# Patient Record
Sex: Female | Born: 1940
Health system: Southern US, Community
[De-identification: ages and names within clinical notes are randomized; demographics above are authoritative.]

## PROBLEM LIST (undated history)

## (undated) DIAGNOSIS — F419 Anxiety disorder, unspecified: Secondary | ICD-10-CM

## (undated) DIAGNOSIS — I10 Essential (primary) hypertension: Secondary | ICD-10-CM

## (undated) DIAGNOSIS — IMO0002 Reserved for concepts with insufficient information to code with codable children: Secondary | ICD-10-CM

## (undated) DIAGNOSIS — K649 Unspecified hemorrhoids: Secondary | ICD-10-CM

## (undated) DIAGNOSIS — E7800 Pure hypercholesterolemia, unspecified: Secondary | ICD-10-CM

## (undated) DIAGNOSIS — M545 Low back pain, unspecified: Secondary | ICD-10-CM

## (undated) DIAGNOSIS — E039 Hypothyroidism, unspecified: Secondary | ICD-10-CM

## (undated) DIAGNOSIS — M1990 Unspecified osteoarthritis, unspecified site: Secondary | ICD-10-CM

## (undated) HISTORY — DX: Anxiety disorder, unspecified: F41.9

## (undated) HISTORY — PX: CATARACT EXTRACTION, BILATERAL: SHX1313

## (undated) HISTORY — DX: Low back pain, unspecified: M54.50

## (undated) HISTORY — DX: Hypothyroidism, unspecified: E03.9

## (undated) HISTORY — DX: Reserved for concepts with insufficient information to code with codable children: IMO0002

## (undated) HISTORY — DX: Essential (primary) hypertension: I10

## (undated) HISTORY — DX: Pure hypercholesterolemia, unspecified: E78.00

## (undated) HISTORY — DX: Low back pain: M54.5

## (undated) HISTORY — PX: TONSILLECTOMY: SUR1361

---

## 1997-11-08 ENCOUNTER — Other Ambulatory Visit: Admission: RE | Admit: 1997-11-08 | Discharge: 1997-11-08 | Payer: Self-pay | Admitting: Obstetrics and Gynecology

## 1998-02-14 ENCOUNTER — Other Ambulatory Visit: Admission: RE | Admit: 1998-02-14 | Discharge: 1998-02-14 | Payer: Self-pay | Admitting: Internal Medicine

## 1998-03-14 ENCOUNTER — Encounter: Payer: Self-pay | Admitting: Internal Medicine

## 1998-03-14 ENCOUNTER — Ambulatory Visit (HOSPITAL_COMMUNITY): Admission: RE | Admit: 1998-03-14 | Discharge: 1998-03-14 | Payer: Self-pay | Admitting: Internal Medicine

## 2000-03-26 ENCOUNTER — Encounter: Payer: Self-pay | Admitting: Internal Medicine

## 2000-03-26 ENCOUNTER — Encounter: Admission: RE | Admit: 2000-03-26 | Discharge: 2000-03-26 | Payer: Self-pay | Admitting: Internal Medicine

## 2000-04-05 ENCOUNTER — Ambulatory Visit (HOSPITAL_COMMUNITY): Admission: RE | Admit: 2000-04-05 | Discharge: 2000-04-05 | Payer: Self-pay | Admitting: Internal Medicine

## 2000-04-05 ENCOUNTER — Encounter: Payer: Self-pay | Admitting: Internal Medicine

## 2004-10-20 ENCOUNTER — Other Ambulatory Visit: Admission: RE | Admit: 2004-10-20 | Discharge: 2004-10-20 | Payer: Self-pay | Admitting: Surgery

## 2006-06-08 ENCOUNTER — Other Ambulatory Visit: Admission: RE | Admit: 2006-06-08 | Discharge: 2006-06-08 | Payer: Self-pay | Admitting: *Deleted

## 2007-07-21 ENCOUNTER — Encounter: Admission: RE | Admit: 2007-07-21 | Discharge: 2007-07-21 | Payer: Self-pay | Admitting: Family Medicine

## 2009-02-08 IMAGING — CR DG HAND COMPLETE 3+V*L*
2 series · 2 of 2 positions shown · non-contrast
Comparison: None

CLINICAL DATA: Injured left fifth finger enhance clear per several
days ago now with redness and swelling.

LEFT HAND - COMPLETE 3+ VIEW

[view not recorded (1 of 2)]
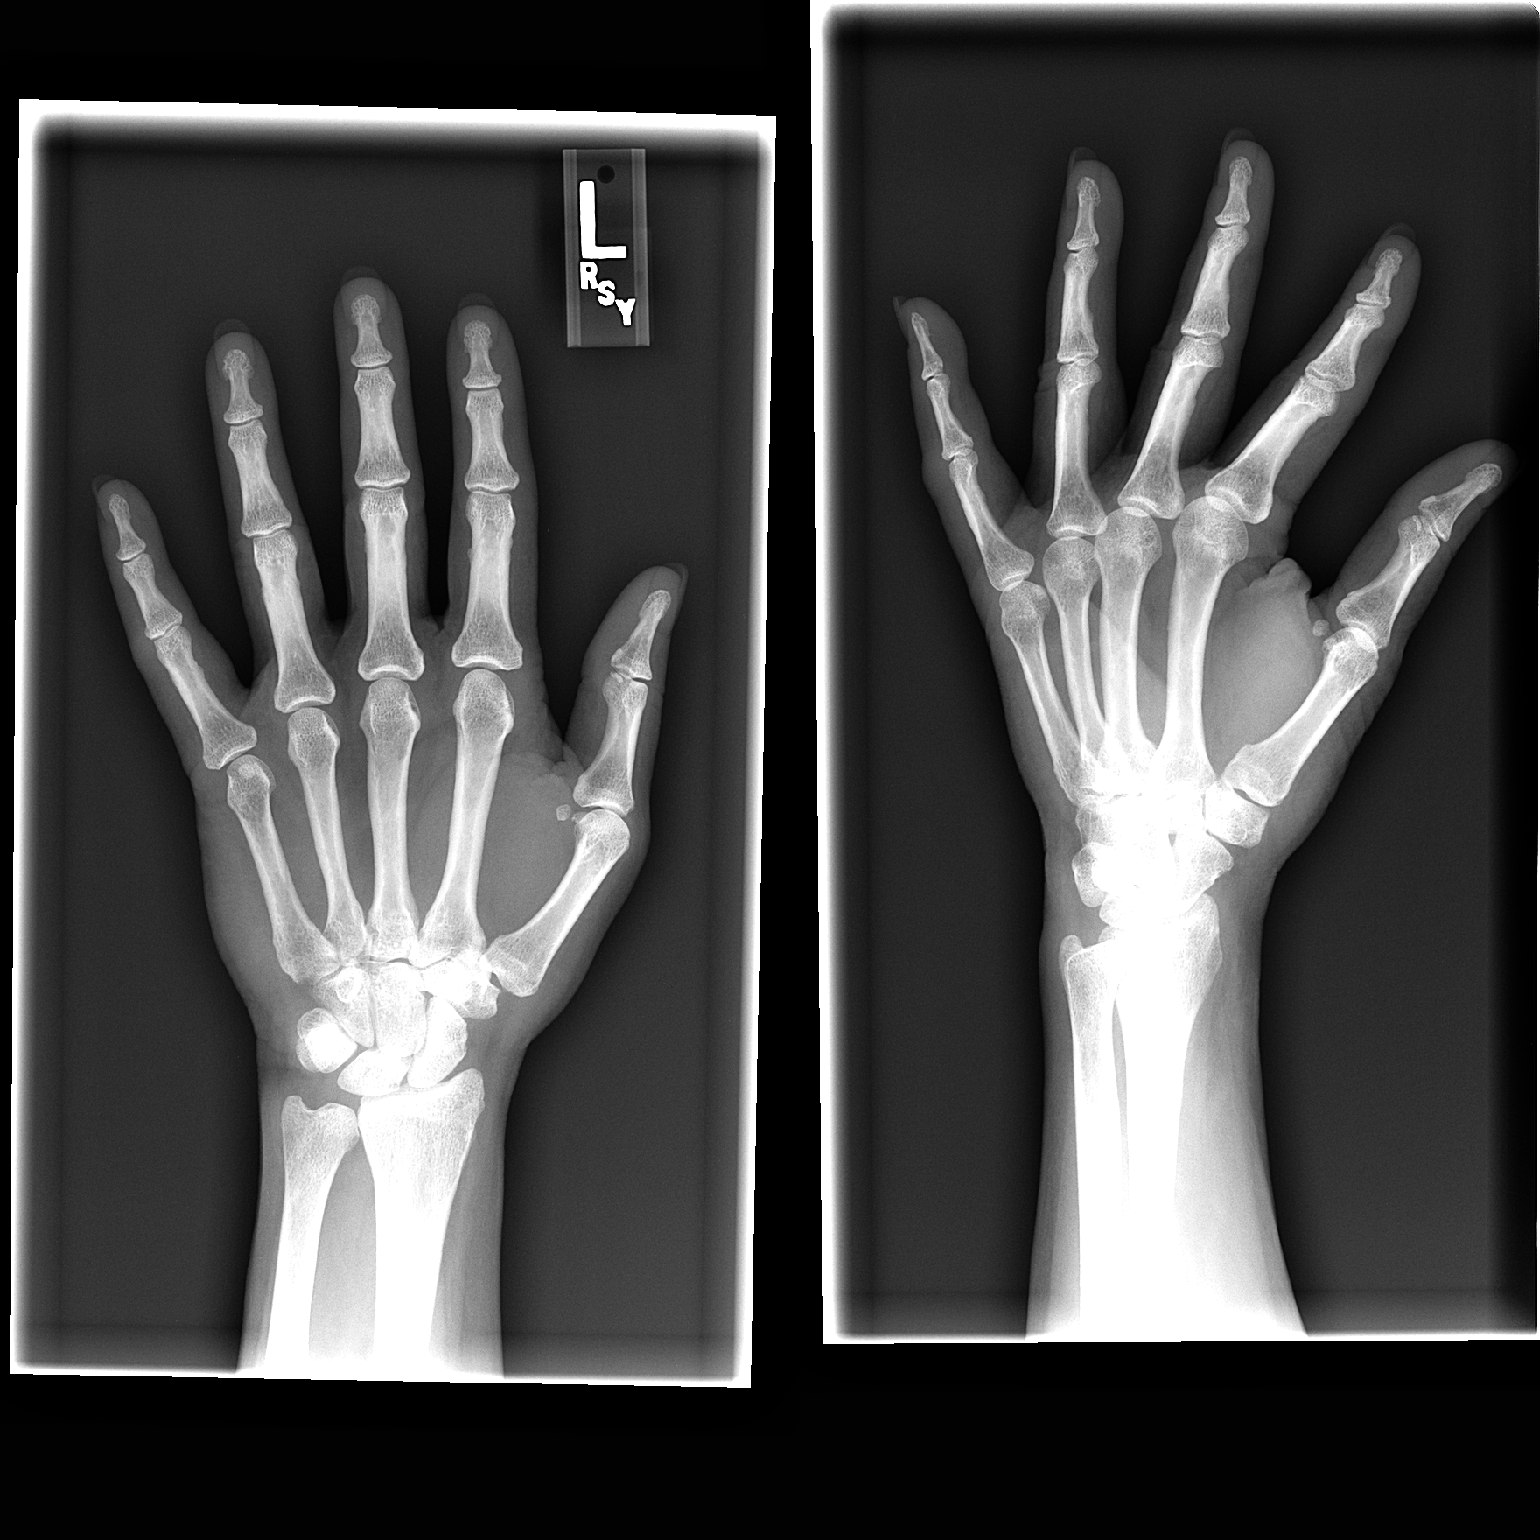

[view not recorded (2 of 2)]
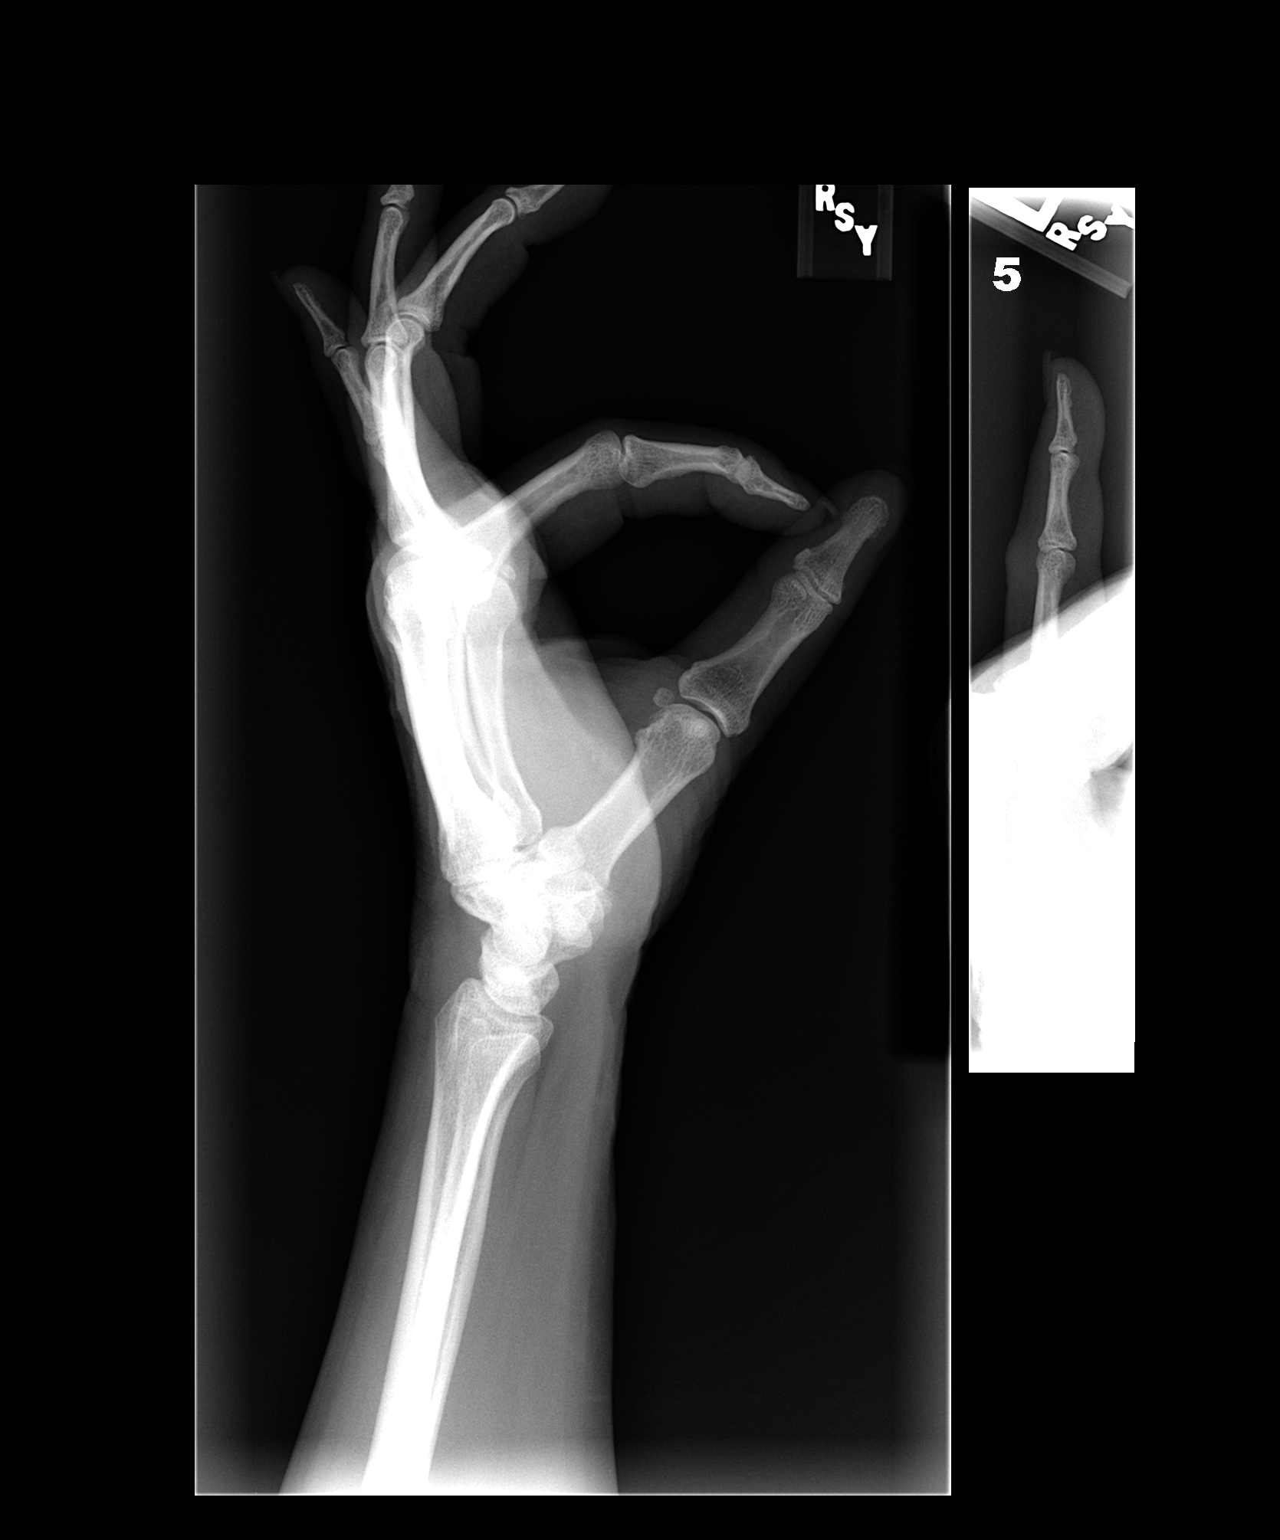

[2 of 2 positions shown; findings below may reference images not displayed]

FINDINGS: No acute fracture is seen.  There is some soft tissue
swelling at the level of the left fifth PIP joint but no bony
abnormality is noted.  Alignment is normal.  The radio carpal joint
space appears normal and the carpal bones are in normal position.
IMPRESSION: No acute bony abnormality.  There is some soft tissue swelling
around the left fifth PIP joint.

## 2009-12-23 ENCOUNTER — Encounter: Admission: RE | Admit: 2009-12-23 | Discharge: 2009-12-23 | Payer: Self-pay | Admitting: Orthopedic Surgery

## 2010-03-02 ENCOUNTER — Encounter: Payer: Self-pay | Admitting: Orthopedic Surgery

## 2010-05-05 ENCOUNTER — Emergency Department (HOSPITAL_COMMUNITY): Payer: Medicare Other

## 2010-05-05 ENCOUNTER — Emergency Department (HOSPITAL_COMMUNITY)
Admission: EM | Admit: 2010-05-05 | Discharge: 2010-05-05 | Disposition: A | Discharge status: Home / Self Care | Payer: Medicare Other | Attending: Emergency Medicine | Admitting: Emergency Medicine

## 2010-05-05 DIAGNOSIS — S82899A Other fracture of unspecified lower leg, initial encounter for closed fracture: Secondary | ICD-10-CM | POA: Insufficient documentation

## 2010-05-05 DIAGNOSIS — Y92009 Unspecified place in unspecified non-institutional (private) residence as the place of occurrence of the external cause: Secondary | ICD-10-CM | POA: Insufficient documentation

## 2010-05-05 DIAGNOSIS — M25579 Pain in unspecified ankle and joints of unspecified foot: Secondary | ICD-10-CM | POA: Insufficient documentation

## 2010-05-05 DIAGNOSIS — I10 Essential (primary) hypertension: Secondary | ICD-10-CM | POA: Insufficient documentation

## 2010-05-05 DIAGNOSIS — X500XXA Overexertion from strenuous movement or load, initial encounter: Secondary | ICD-10-CM | POA: Insufficient documentation

## 2010-05-05 DIAGNOSIS — E039 Hypothyroidism, unspecified: Secondary | ICD-10-CM | POA: Insufficient documentation

## 2010-05-05 DIAGNOSIS — E119 Type 2 diabetes mellitus without complications: Secondary | ICD-10-CM | POA: Insufficient documentation

## 2010-05-05 DIAGNOSIS — E7800 Pure hypercholesterolemia, unspecified: Secondary | ICD-10-CM | POA: Insufficient documentation

## 2010-05-06 ENCOUNTER — Encounter: Payer: Self-pay | Admitting: *Deleted

## 2010-05-07 ENCOUNTER — Encounter: Payer: Self-pay | Admitting: *Deleted

## 2010-05-07 ENCOUNTER — Ambulatory Visit (INDEPENDENT_AMBULATORY_CARE_PROVIDER_SITE_OTHER): Payer: Medicare Other | Admitting: Cardiovascular Disease

## 2010-05-07 ENCOUNTER — Encounter: Payer: Self-pay | Admitting: Cardiovascular Disease

## 2010-05-07 ENCOUNTER — Encounter: Payer: BLUE CROSS/BLUE SHIELD | Admitting: Cardiovascular Disease

## 2010-05-07 VITALS — BP 148/75 | HR 78 | Resp 12 | Ht 64.0 in | Wt 189.0 lb

## 2010-05-07 DIAGNOSIS — R9431 Abnormal electrocardiogram [ECG] [EKG]: Secondary | ICD-10-CM | POA: Insufficient documentation

## 2010-05-07 DIAGNOSIS — E7800 Pure hypercholesterolemia, unspecified: Secondary | ICD-10-CM

## 2010-05-07 DIAGNOSIS — I10 Essential (primary) hypertension: Secondary | ICD-10-CM | POA: Insufficient documentation

## 2010-05-07 DIAGNOSIS — Z01810 Encounter for preprocedural cardiovascular examination: Secondary | ICD-10-CM

## 2010-05-07 NOTE — Assessment & Plan Note (Signed)
Clear for surgery on Friday.  Bystolic started to lower risk

## 2010-05-07 NOTE — Patient Instructions (Signed)
Your physician recommends that you schedule a follow-up appointment in:  2 weeks after surgery with Dr. Eden Emms bystolic 5 mg daily by mouth

## 2010-05-07 NOTE — Assessment & Plan Note (Signed)
Well controlled.  Continue current medications and low sodium Dash type diet.  Continue ACE with DM

## 2010-05-07 NOTE — Assessment & Plan Note (Signed)
ECG normal in our office.  Low voltage due to body habitus

## 2010-05-07 NOTE — Progress Notes (Signed)
70 yo referred by Dr Arlyss Gandy and [facility] outpatient anesthesia for preoperative clearance.  She broke her right fibula and tore ligaments on Monday. Casted and needs surgery on Friday.  ON lovenox starting today.  CRF's HTN and type 2 DM.  No previoius anesthetic or surgical problems.  Prior to fall activity limited by arthritis but no angina, history of cardiac problems, no palpitatons,dyspnea or edema.  ECG reviewed from Dr Cammie Mcgee office essentially normal with nonspecific T wave changes.  ECG in our office today was normal.  Discussed risk stratification.  No indicatoin for stress test in asymptomatic person not having vascular surgery.  Lovenox to continue until morning of surgery.  Start bystolic 5mg  and take morning of surgery.  F/U with me in two weeks.  BB will be transient medicine to decrease cardiovascular risk  ROS: Denies fever, malais, weight loss, blurry vision, decreased visual acuity, cough, sputum, SOB, hemoptysis, pleuritic pain, palpitaitons, heartburn, abdominal pain, melena, lower extremity edema, claudication, or rash.   General: Affect appropriate Healthy:  appears stated age HEENT: normal Neck supple with no adenopathy JVP normal no bruits no thyromegaly Lungs clear with no wheezing and good diaphragmatic motion Heart:  S1/S2 no murmur,rub, gallop or click PMI normal Abdomen: benighn, BS positve, no tenderness, no AAA no bruit.  No HSM or HJR Distal pulses intact with no bruits No edema Neuro non-focal Skin warm and dry No muscular weakness RLE in cast with mild swelling of foot  Current Outpatient Prescriptions  Medication Sig Dispense Refill  . atorvastatin (LIPITOR) 20 MG tablet Take 20 mg by mouth daily.        . busPIRone (BUSPAR) 30 MG tablet Take 30 mg by mouth 2 (two) times daily.        Marland Kitchen enoxaparin (LOVENOX) 40 MG/0.4ML SOLN Inject into the skin. daily       . glipiZIDE (GLUCOTROL) 2.5 mg TABS Take by mouth daily before breakfast.        .  HYDROcodone-acetaminophen (NORCO) 5-325 MG per tablet Take 1 tablet by mouth every 6 (six) hours as needed.        Marland Kitchen levothyroxine (SYNTHROID, LEVOTHROID) 88 MCG tablet Take 88 mcg by mouth daily.        Marland Kitchen lisinopril (PRINIVIL,ZESTRIL) 20 MG tablet Take 20 mg by mouth daily.        Marland Kitchen LORazepam (ATIVAN) 1 MG tablet Take 1 mg by mouth as needed.        . methocarbamol (ROBAXIN) 500 MG tablet 4 (four) times daily. 2 tabs po 4 times daily       . zolpidem (AMBIEN) 10 MG tablet Take 10 mg by mouth at bedtime as needed.          Allergies  Review of patient's allergies indicates no known allergies.  Electrocardiogram:  NSR 78 Normal ECG with poor R wave progression secondary to body habitus  Assessment and Plan

## 2010-05-07 NOTE — Assessment & Plan Note (Signed)
Continue statin Labs per primary at [facility]

## 2010-05-08 ENCOUNTER — Encounter: Payer: Self-pay | Admitting: Cardiovascular Disease

## 2010-05-08 ENCOUNTER — Telehealth: Payer: Self-pay | Admitting: Cardiovascular Disease

## 2010-05-08 NOTE — Telephone Encounter (Signed)
Faxed OV & EKG to [place] at [facility] ([phone]).

## 2010-05-09 ENCOUNTER — Ambulatory Visit (HOSPITAL_BASED_OUTPATIENT_CLINIC_OR_DEPARTMENT_OTHER)
Admission: RE | Admit: 2010-05-09 | Discharge: 2010-05-09 | Disposition: A | Discharge status: Home / Self Care | Payer: Medicare Other | Source: Ambulatory Visit | Attending: Orthopedic Surgery | Admitting: Orthopedic Surgery

## 2010-05-09 DIAGNOSIS — W19XXXA Unspecified fall, initial encounter: Secondary | ICD-10-CM | POA: Insufficient documentation

## 2010-05-09 DIAGNOSIS — Z01812 Encounter for preprocedural laboratory examination: Secondary | ICD-10-CM | POA: Insufficient documentation

## 2010-05-09 DIAGNOSIS — S8263XA Displaced fracture of lateral malleolus of unspecified fibula, initial encounter for closed fracture: Secondary | ICD-10-CM | POA: Insufficient documentation

## 2010-05-09 DIAGNOSIS — I10 Essential (primary) hypertension: Secondary | ICD-10-CM | POA: Insufficient documentation

## 2010-05-09 DIAGNOSIS — E669 Obesity, unspecified: Secondary | ICD-10-CM | POA: Insufficient documentation

## 2010-05-09 DIAGNOSIS — E039 Hypothyroidism, unspecified: Secondary | ICD-10-CM | POA: Insufficient documentation

## 2010-05-09 DIAGNOSIS — E119 Type 2 diabetes mellitus without complications: Secondary | ICD-10-CM | POA: Insufficient documentation

## 2010-05-09 LAB — POCT I-STAT, CHEM 8
Calcium, Ion: 1.12 mmol/L (ref 1.12–1.32)
Chloride: 95 mEq/L — ABNORMAL LOW (ref 96–112)
Glucose, Bld: 155 mg/dL — ABNORMAL HIGH (ref 70–99)
HCT: 39 % (ref 36.0–46.0)
TCO2: 27 mmol/L (ref 0–100)

## 2010-05-09 LAB — GLUCOSE, CAPILLARY: Glucose-Capillary: 167 mg/dL — ABNORMAL HIGH (ref 70–99)

## 2010-05-11 HISTORY — PX: LEG SURGERY: SHX1003

## 2010-05-28 ENCOUNTER — Ambulatory Visit (INDEPENDENT_AMBULATORY_CARE_PROVIDER_SITE_OTHER): Payer: Medicare Other | Admitting: Cardiovascular Disease

## 2010-05-28 ENCOUNTER — Encounter: Payer: Self-pay | Admitting: Cardiovascular Disease

## 2010-05-28 DIAGNOSIS — E7800 Pure hypercholesterolemia, unspecified: Secondary | ICD-10-CM

## 2010-05-28 DIAGNOSIS — I10 Essential (primary) hypertension: Secondary | ICD-10-CM

## 2010-05-28 MED ORDER — NEBIVOLOL HCL 5 MG PO TABS: ORAL_TABLET | ORAL | Status: DC

## 2010-05-28 NOTE — Patient Instructions (Signed)
Your physician recommends that you schedule a follow-up appointment in: 3 months with Dr. Nishan 

## 2010-05-28 NOTE — Progress Notes (Signed)
70 yo referred by Dr Arlyss Gandy and [facility] outpatient anesthesia for preoperative clearance on 3/28  She broke her right fibula and tore ligaments  Casted and had uneventful surgery 3/30.  Started on Bystolic 5mg  prior to.   CRF's HTN and type 2 DM.  No previoius anesthetic or surgical problems.  Prior to fall activity limited by arthritis but no angina, history of cardiac problems, no palpitatons,dyspnea or edema.  ECG reviewed from Dr Cammie Mcgee office essentially normal with nonspecific T wave changes.  ECG in our office today was normal.  Still has boot on leg and needs rehab.  Prefer to continue bystolic until walking normally and stress of rehab done.     ROS: Denies fever, malais, weight loss, blurry vision, decreased visual acuity, cough, sputum, SOB, hemoptysis, pleuritic pain, palpitaitons, heartburn, abdominal pain, melena, lower extremity edema, claudication, or rash.   General: Affect appropriate Healthy:  appears stated age HEENT: normal Neck supple with no adenopathy JVP normal no bruits no thyromegaly Lungs clear with no wheezing and good diaphragmatic motion Heart:  S1/S2 no murmur,rub, gallop or click PMI normal Abdomen: benighn, BS positve, no tenderness, no AAA no bruit.  No HSM or HJR Distal pulses intact with no bruits Right leg in boot S/P tib/fib surgery Neuro non-focal Skin warm and dry No muscular weakness RLE in cast with mild swelling of foot  Current Outpatient Prescriptions  Medication Sig Dispense Refill  . Ascorbic Acid (VITAMIN C) 500 MG tablet Take 500 mg by mouth daily.        Marland Kitchen aspirin 325 MG tablet Take 325 mg by mouth 2 (two) times daily.        Marland Kitchen atorvastatin (LIPITOR) 20 MG tablet Take 20 mg by mouth daily.        . busPIRone (BUSPAR) 30 MG tablet Take 30 mg by mouth 2 (two) times daily.        Marland Kitchen glipiZIDE (GLUCOTROL) 2.5 mg TABS Take by mouth daily before breakfast.        . levothyroxine (SYNTHROID, LEVOTHROID) 88 MCG tablet Take 88 mcg by mouth  daily.        Marland Kitchen lisinopril (PRINIVIL,ZESTRIL) 20 MG tablet Take 20 mg by mouth daily.        Marland Kitchen LORazepam (ATIVAN) 1 MG tablet Take 1 mg by mouth as needed.        . methocarbamol (ROBAXIN) 500 MG tablet 4 (four) times daily. 2 tabs po 4 times daily       . nebivolol (BYSTOLIC) 5 MG tablet Take 5 mg by mouth daily. Take until next dr. appointment       . oxyCODONE-acetaminophen (PERCOCET) 5-325 MG per tablet Take 1 tablet by mouth every 4 (four) hours as needed.        . zolpidem (AMBIEN) 10 MG tablet Take 10 mg by mouth at bedtime as needed.        Marland Kitchen DISCONTD: enoxaparin (LOVENOX) 40 MG/0.4ML SOLN Inject into the skin. daily       . DISCONTD: HYDROcodone-acetaminophen (NORCO) 5-325 MG per tablet Take 1 tablet by mouth every 6 (six) hours as needed.          Allergies  Review of patient's allergies indicates no known allergies.  Electrocardiogram:  NSR 78 Normal ECG with poor R wave progression secondary to body habitus  05/07/10  Assessment and Plan

## 2010-05-28 NOTE — Assessment & Plan Note (Signed)
Lead placement  No evidence of CAD Resolved

## 2010-05-28 NOTE — Assessment & Plan Note (Signed)
Continue Bystolic  Reevaluate in 3 months

## 2010-05-28 NOTE — Assessment & Plan Note (Signed)
Cholesterol is at goal.  Continue current dose of statin and diet Rx.  No myalgias or side effects.  F/U  LFT's in 6 months. No results found for this basename: LDLCALC             

## 2010-05-31 NOTE — Op Note (Signed)
  Kelly Elliott, Kelly Elliott                    ACCOUNT NO.:  [id number]  MEDICAL RECORD NO.:  [id number]          PATIENT TYPE:  LOCATION:                                 FACILITY:  PHYSICIAN:  Leonides Grills, M.D.     DATE OF BIRTH:  10/27/1940  DATE OF PROCEDURE:  05/09/2010 DATE OF DISCHARGE:                              OPERATIVE REPORT   PREOPERATIVE DIAGNOSIS:  Right SER-4 equivalent right lateral malleolus fracture.  POSTOPERATIVE DIAGNOSIS:  Right SER-4 equivalent right lateral malleolus fracture.  OPERATION:  Open reduction and internal fixation, right lateral malleolus fracture.  ANESTHESIA:  General with popliteal block.  SURGEON:  Leonides Grills, MD  ASSISTANT:  Richardean Canal, PA-C.  ESTIMATED BLOOD LOSS:  Minimal.  TOURNIQUET TIME:  Approximately 1/2 hour.  COMPLICATIONS:  None.  DISPOSITION:  Stable to PR.  INDICATIONS:  This is a 70 year old female who sustained the above injury.  She was consented for the above procedure.  All risks of infection, neurovascular injury, nonunion, malunion, hardware irritation, hardware failure, persistent pain, worse pain, prolonged recovery, stiffness, arthritis, possibility of an osteochondral lesion, wound healing problems, DVT, PE were all explained.  Questions were encouraged and answered.  OPERATIVE DETAILS:  The patient was brought to the operating room and placed in supine position after adequate general anesthesia was administered as well as Ancef 1 gram IV piggyback.  A popliteal block was also performed prior to anesthesia administration as well.  Bump was placed in the right ipsilateral hip, internally rotating the right lower extremity.  Right lower extremity was prepped and draped in a sterile manner.  Over a proximally thigh tourniquet, limb was gravity exsanguinated and tourniquet was elevated to 290 mmHg.  A longitudinal incision over the lateral aspect of the right ankle centered over the lateral malleolus was  then made.  Dissection was carried down as full thickness down to bone.  Fracture site was then entered.  Hematoma and periosteum within the fracture site was removed.  The fracture was then anatomically reduced with a 2-point reduction clamp.  A 4-hole one-third tubular plate was then placed in antiglide configuration.  Four 3.5 mm fully-threaded cortical set screws were applied using a 2.5 mm drill hole respectively.  This had excellent purchase and maintenance of the anatomic fixation and alignment.  Stress x-rays were obtained in AP, lateral, and mortis views that showed no gross motion, fixation, proposition, and excellent as well.  Tourniquet was deflated. Hemostasis was obtained.  There was no pulsatile bleeding.  The area was copiously irrigated with normal saline.  Subcu was closed with 3-0 Vicryl and skin was closed with 4-0 nylon.  Sterile dressing was applied.  Modified Jones dressing was applied with the ankle in neutral dorsiflexion.  The patient was stable to PR.     Leonides Grills, M.D.     PB/MEDQ  D:  05/09/2010  T:  05/09/2010  Job:  [id number]  Electronically Signed by Leonides Grills M.D. on 05/31/2010 07:49:31 AM

## 2010-05-31 NOTE — Consult Note (Signed)
  NAMESHALI, Kelly Elliott                    ACCOUNT NO.:  [id number]  MEDICAL RECORD NO.:  [id number]           PATIENT TYPE:  E  LOCATION:  [facility]                         FACILITY:  [facility]  PHYSICIAN:  Leonides Grills, M.D.     DATE OF BIRTH:  1940/12/06  DATE OF CONSULTATION:  05/05/2010 DATE OF DISCHARGE:  05/05/2010                                CONSULTATION   HISTORY:  This is a 70 year old female who fell today and twisted her right ankle.  She had immediate pain and was then taken to [facility] ED where x-rays were obtained and we were then consulted further evaluation and treatment.  PAST MEDICAL HISTORY: 1. Anxiety. 2. Diabetes. 3. Hypercholesterolemia. 4. Hypertension. 5. Hypothyroidism. 6. Lumbar pain.  PAST SURGICAL HISTORY:  She was recently to have a cataract surgery, but the surgery was cancelled due to unknown EKG findings.  She was suppose to have a chemical stress test, but did not schedule this as of yet.  ALLERGIES:  She has no known drug allergies.  MEDICATIONS:  She takes buspirone, glipizide, levothyroxine, Lipitor, lisinopril.  She does not smoke.  REVIEW OF SYSTEMS:  She denies any fever, chills, rheumatoid arthritis, lupus, gout, or loss of consciousness.  PHYSICAL EXAMINATION:  VITAL SIGNS:  She is 98.1, blood pressure is 152/89, respirations 18, pulses 91. GENERAL:  Well-nourished, well-developed, in no apparent distress, very pleasant female.  Alert and oriented x3. HEENT:  She is normocephalic, atraumatic.  Extraocular motions are intact, equal bilaterally. CHEST:  Equal bilateral expansion.  Contracted breathing. EXTREMITIES:  She had well fitted splints, passive and active range of motion of toes always have pain.  She has sensation to light touch over her toes.  X-rays, reviews of her ankle show a SER II type lateral malleolus fracture with slight lateral subluxation of the talus with ankle mortise is located.  IMPRESSION:  SER IV  equivalent right lateral malleolus fracture.  PLAN:  I explained to Kelly Elliott at this point, she is to keep this elevated and she is nonweightbearing when placed her on Lovenox until she is able to have surgery, which will likely be next week due to swelling.  I explained to her that if she does not elevate this and move her toes, then we may have to postponed the surgery even further.  She would also need preoperative clearance prior to surgery as well and likely need a cardiac stress test.  I went over this with the patient as well as her daughter and all questions were encouraged.  As per the Surgery, she will require an open reduction and internal fixation of her right lateral malleolus fracture.  We also reviewed the risks, infection, vessel injury, nonunion, malunion, hardware irritation, hardware failure, persistent pain, worse pain, prolonged recovery, stiffness, arthritis, wound healing problems, DVT, PE, even death were all explained.  Questions were encouraged and answered.     Leonides Grills, M.D.     PB/MEDQ  D:  05/05/2010  T:  05/06/2010  Job:  [id number]  Electronically Signed by Leonides Grills M.D. on 05/31/2010 07:49:27 AM

## 2010-08-27 ENCOUNTER — Encounter: Payer: Self-pay | Admitting: Cardiovascular Disease

## 2010-08-27 ENCOUNTER — Ambulatory Visit (INDEPENDENT_AMBULATORY_CARE_PROVIDER_SITE_OTHER): Payer: Medicare Other | Admitting: Cardiovascular Disease

## 2010-08-27 VITALS — BP 140/80 | HR 75 | Resp 14 | Ht 64.0 in | Wt 198.0 lb

## 2010-08-27 DIAGNOSIS — E785 Hyperlipidemia, unspecified: Secondary | ICD-10-CM

## 2010-08-27 DIAGNOSIS — Z79899 Other long term (current) drug therapy: Secondary | ICD-10-CM

## 2010-08-27 DIAGNOSIS — I10 Essential (primary) hypertension: Secondary | ICD-10-CM

## 2010-08-27 MED ORDER — LOSARTAN POTASSIUM-HCTZ 100-12.5 MG PO TABS: 1.0000 | ORAL_TABLET | Freq: Every day | ORAL | Status: DC

## 2010-08-27 NOTE — Assessment & Plan Note (Signed)
Resolved no evidence of CAD

## 2010-08-27 NOTE — Assessment & Plan Note (Signed)
BP labile but ok.  Related to anxiety when it spikes  Change to Hyzaar and check BMET in 4 weeks

## 2010-08-27 NOTE — Assessment & Plan Note (Signed)
Cholesterol is at goal.  Continue current dose of statin and diet Rx.  No myalgias or side effects.  F/U  LFT's in 6 months. No results found for this basename: LDLCALC             

## 2010-08-27 NOTE — Patient Instructions (Signed)
Your physician recommends that you schedule a follow-up appointment in: 4 WEEKS WITH DR [facility]  Your physician has recommended you make the following change in your medication: STOP LISINOPRIL START HYZAAR 100-12.5 MG  EVERY DAY Your physician recommends that you return for lab work in: 4 WEEKS  BMET  DX V58.69 You have been referred to DR [facility]

## 2010-08-27 NOTE — Progress Notes (Signed)
70 yo initially referred by Dr Kelly Elliott and Kelly Elliott outpatient anesthesia for preoperative clearance on 3/28 She broke her right fibula and tore ligaments Casted and had uneventful surgery 3/30. Started on Bystolic 5mg  prior to. CRF's HTN and type 2 DM. No previoius anesthetic or surgical problems. Prior to fall activity limited by arthritis but no angina, history of cardiac problems, no palpitatons,dyspnea or edema. ECG reviewed from Dr Kelly Elliott office essentially normal with nonspecific T wave changes. ECG in our office today was normal.  Now out of boot but still with some RLE weakness from back problems.  Anxious about cataract surgery tomorrow with her ex husband Dr Kelly Elliott.  BP is labile.  Stopped bystolic on her own and now on somewhat unusual dosing of lisinopril.  Given DM should be on ACE/ARB  Will change to Hyzaar 100/12,5 generic.  No need for heart testing before cataract surgery.   Wants to be referred to [facility] female primary.  Recommended Kelly Elliott  ROS: Denies fever, malais, weight loss, blurry vision, decreased visual acuity, cough, sputum, SOB, hemoptysis, pleuritic pain, palpitaitons, heartburn, abdominal pain, melena, lower extremity edema, claudication, or rash.  All other systems reviewed and negative  General: Affect appropriate Healthy:  appears stated age HEENT: normal Neck supple with no adenopathy JVP normal no bruits no thyromegaly Lungs clear with no wheezing and good diaphragmatic motion Heart:  S1/S2 no murmur,rub, gallop or click PMI normal Abdomen: benighn, BS positve, no tenderness, no AAA no bruit.  No HSM or HJR Distal pulses intact with no bruits No edema Neuro non-focal Skin warm and dry No muscular weakness   Current Outpatient Prescriptions  Medication Sig Dispense Refill  . Ascorbic Acid (VITAMIN C) 500 MG tablet Take 500 mg by mouth daily.        Marland Kitchen aspirin 81 MG tablet Take 81 mg by mouth daily.        Marland Kitchen atorvastatin (LIPITOR) 20 MG  tablet Take 20 mg by mouth daily.        . busPIRone (BUSPAR) 30 MG tablet Take 30 mg by mouth 2 (two) times daily.        . Calcium Carbonate (CALTRATE 600 PO) Take 1 tablet by mouth daily.        . Cholecalciferol (VITAMIN D) 1000 UNITS capsule Take 1,000 Units by mouth daily.        Marland Kitchen glipiZIDE (GLUCOTROL) 2.5 mg TABS Take by mouth daily before breakfast.        . levothyroxine (SYNTHROID, LEVOTHROID) 88 MCG tablet Take 88 mcg by mouth daily.        Marland Kitchen LORazepam (ATIVAN) 1 MG tablet Take 1 mg by mouth as needed.        . zolpidem (AMBIEN) 10 MG tablet Take 10 mg by mouth at bedtime as needed.          Allergies  Fluorescein and Menthol  Electrocardiogram:  Assessment and Plan

## 2010-09-02 ENCOUNTER — Telehealth: Payer: Self-pay | Admitting: Cardiovascular Disease

## 2010-09-02 DIAGNOSIS — Z79899 Other long term (current) drug therapy: Secondary | ICD-10-CM

## 2010-09-02 DIAGNOSIS — I10 Essential (primary) hypertension: Secondary | ICD-10-CM

## 2010-09-02 NOTE — Telephone Encounter (Signed)
Spoke with pt, she called to report some symptoms from the hyzaar. She has had increase urination and understands that is coming from the HCTZ but she has a history of low sodium and is concerned this med is effecting that. She also is having dizziness and nausea. Her bp today has been 179/98, 168/98 and 179/86. She will take 1/2 tablet tomorrow to see if her symptoms improve. She has a f/u with dr Eden Emms and blood work scheduled 09-26-10.   Will forward for dr Eden Emms review  Kelly Elliott

## 2010-09-02 NOTE — Telephone Encounter (Signed)
Yes continue.  Can check BMET 2 weeks after she has taken it

## 2010-09-02 NOTE — Telephone Encounter (Signed)
Pt on hyzaar, having some side effects and wants to know if she should discontinue

## 2010-09-03 NOTE — Telephone Encounter (Signed)
Spoke with pt, she took 1/2 the dose of cozaar last night and her bp was down this am and she did not have the symptoms as before. She will come to the office nest Tuesday for labs [facility]

## 2010-09-09 ENCOUNTER — Other Ambulatory Visit (INDEPENDENT_AMBULATORY_CARE_PROVIDER_SITE_OTHER): Payer: Medicare Other | Admitting: *Deleted

## 2010-09-09 DIAGNOSIS — I10 Essential (primary) hypertension: Secondary | ICD-10-CM

## 2010-09-09 DIAGNOSIS — Z79899 Other long term (current) drug therapy: Secondary | ICD-10-CM

## 2010-09-09 LAB — BASIC METABOLIC PANEL
BUN: 25 mg/dL — ABNORMAL HIGH (ref 6–23)
Calcium: 8.9 mg/dL (ref 8.4–10.5)
Chloride: 87 mEq/L — ABNORMAL LOW (ref 96–112)
Creatinine, Ser: 0.8 mg/dL (ref 0.4–1.2)

## 2010-09-26 ENCOUNTER — Other Ambulatory Visit (INDEPENDENT_AMBULATORY_CARE_PROVIDER_SITE_OTHER): Payer: Medicare Other | Admitting: *Deleted

## 2010-09-26 ENCOUNTER — Ambulatory Visit (INDEPENDENT_AMBULATORY_CARE_PROVIDER_SITE_OTHER): Payer: Medicare Other | Admitting: Cardiovascular Disease

## 2010-09-26 ENCOUNTER — Encounter: Payer: Self-pay | Admitting: Cardiovascular Disease

## 2010-09-26 DIAGNOSIS — E7800 Pure hypercholesterolemia, unspecified: Secondary | ICD-10-CM

## 2010-09-26 DIAGNOSIS — Z79899 Other long term (current) drug therapy: Secondary | ICD-10-CM

## 2010-09-26 DIAGNOSIS — I10 Essential (primary) hypertension: Secondary | ICD-10-CM

## 2010-09-26 DIAGNOSIS — R609 Edema, unspecified: Secondary | ICD-10-CM | POA: Insufficient documentation

## 2010-09-26 LAB — BASIC METABOLIC PANEL
BUN: 24 mg/dL — ABNORMAL HIGH (ref 6–23)
Calcium: 9 mg/dL (ref 8.4–10.5)
GFR: 74.74 mL/min (ref >60.00)
Glucose, Bld: 105 mg/dL — ABNORMAL HIGH (ref 70–99)
Potassium: 4.5 mEq/L (ref 3.5–5.1)
Sodium: 135 mEq/L (ref 135–145)

## 2010-09-26 MED ORDER — LISINOPRIL 20 MG PO TABS: ORAL_TABLET | ORAL | Status: DC

## 2010-09-26 NOTE — Assessment & Plan Note (Signed)
S/P RLE fracture with dependant edema.  Left knee miniscal tear with mild edema.  She prefers no diuretic at this time.  Has support hose on RLE.  Low sodium diet

## 2010-09-26 NOTE — Patient Instructions (Signed)
Your physician recommends that you schedule a follow-up appointment in: 3 MONTHS  LISINOPRIL 20 MG TWO TABLETS IN THE AM AND ONE TABLET IN THE PM

## 2010-09-26 NOTE — Assessment & Plan Note (Signed)
Well controlled.  Continue current medications and low sodium Dash type diet.   Contnue lisinopril at patient request

## 2010-09-26 NOTE — Progress Notes (Signed)
70 yo initially referred by Dr Arlyss Gandy and [facility] outpatient anesthesia for preoperative clearance on 3/28 She broke her right fibula and tore ligaments Casted and had uneventful surgery 3/30. Started on Bystolic 5mg  prior to. CRF's HTN and type 2 DM. No previoius anesthetic or surgical problems. Prior to fall activity limited by arthritis but no angina, history of cardiac problems, no palpitatons,dyspnea or edema. ECG reviewed from Dr Cammie Mcgee office essentially normal with nonspecific T wave changes. ECG in our office today was normal. Now out of boot but still with some RLE weakness from back problems. Anxious about cataract surgery tomorrow with her ex husband Dr Darl Householder. BP is labile. Stopped bystolic on her own and now on somewhat unusual dosing of lisinopril.  She did not likel Hyzaar and restarted her lisinopril at 60 mg   Wants to be referred to [place] female primary. Recommended Val Leschber  ROS: Denies fever, malais, weight loss, blurry vision, decreased visual acuity, cough, sputum, SOB, hemoptysis, pleuritic pain, palpitaitons, heartburn, abdominal pain, melena, lower extremity edema, claudication, or rash.  All other systems reviewed and negative  General: Affect appropriate Healthy:  appears stated age HEENT: normal Neck supple with no adenopathy JVP normal no bruits no thyromegaly Lungs clear with no wheezing and good diaphragmatic motion Heart:  S1/S2 no murmur,rub, gallop or click PMI normal Abdomen: benighn, BS positve, no tenderness, no AAA no bruit.  No HSM or HJR Distal pulses intact with no bruits Plus one LLE  edema Neuro non-focal Skin warm and dry No muscular weakness   Current Outpatient Prescriptions  Medication Sig Dispense Refill  . Ascorbic Acid (VITAMIN C) 500 MG tablet Take 500 mg by mouth daily.        Marland Kitchen aspirin 81 MG tablet Take 81 mg by mouth daily.        Marland Kitchen atorvastatin (LIPITOR) 20 MG tablet Take 20 mg by mouth daily.        . busPIRone  (BUSPAR) 30 MG tablet Take 30 mg by mouth 2 (two) times daily.        . Calcium Carbonate (CALTRATE 600 PO) Take 1 tablet by mouth daily.        Marland Kitchen glipiZIDE (GLUCOTROL) 2.5 mg TABS Take by mouth daily before breakfast.        . levothyroxine (SYNTHROID, LEVOTHROID) 88 MCG tablet Take 88 mcg by mouth daily.        Marland Kitchen lisinopril (PRINIVIL,ZESTRIL) 20 MG tablet       . LORazepam (ATIVAN) 1 MG tablet Take 1 mg by mouth as needed.        Marland Kitchen losartan-hydrochlorothiazide (HYZAAR) 100-12.5 MG per tablet Take 1 tablet by mouth daily.  30 tablet  11  . zolpidem (AMBIEN) 10 MG tablet Take 10 mg by mouth at bedtime as needed.          Allergies  Fluorescein and Menthol  Electrocardiogram:  Assessment and Plan

## 2010-09-26 NOTE — Assessment & Plan Note (Signed)
Cholesterol is at goal.  Continue current dose of statin and diet Rx.  No myalgias or side effects.  F/U  LFT's in 6 months. No results found for this basename: LDLCALC             

## 2010-10-02 ENCOUNTER — Telehealth: Payer: Self-pay | Admitting: Cardiovascular Disease

## 2010-10-02 NOTE — Telephone Encounter (Signed)
Spoke with pt, aware of lab results Kelly Elliott  

## 2010-10-02 NOTE — Telephone Encounter (Signed)
Pt calling re lab results, especially the sodium level

## 2010-10-06 ENCOUNTER — Ambulatory Visit: Payer: Medicare Other | Admitting: Family Medicine

## 2010-11-17 ENCOUNTER — Ambulatory Visit (INDEPENDENT_AMBULATORY_CARE_PROVIDER_SITE_OTHER): Payer: Medicare Other | Admitting: Family Medicine

## 2010-11-17 ENCOUNTER — Encounter: Payer: Self-pay | Admitting: Family Medicine

## 2010-11-17 DIAGNOSIS — E785 Hyperlipidemia, unspecified: Secondary | ICD-10-CM

## 2010-11-17 DIAGNOSIS — E7800 Pure hypercholesterolemia, unspecified: Secondary | ICD-10-CM

## 2010-11-17 DIAGNOSIS — I10 Essential (primary) hypertension: Secondary | ICD-10-CM

## 2010-11-17 DIAGNOSIS — Z23 Encounter for immunization: Secondary | ICD-10-CM

## 2010-11-17 DIAGNOSIS — E119 Type 2 diabetes mellitus without complications: Secondary | ICD-10-CM | POA: Insufficient documentation

## 2010-11-17 NOTE — Assessment & Plan Note (Signed)
Stable--- a little elevated today but pt nervous about meeting a new Doctor Her bp running 130/70 at home

## 2010-11-17 NOTE — Progress Notes (Signed)
  Subjective:    Patient ID: Kelly Elliott, female    DOB: 08/26/1940, 70 y.o.   MRN: [id number]  HPI Pt here to establish and review labs from previous PCP.   HYPERTENSION Disease Monitoring Blood pressure range-130/70 Chest pain- no      Dyspnea- no Medications Compliance- good Lightheadedness- no   Edema- no   DIABETES Disease Monitoring Blood Sugar ranges-90-135 Polyuria- no New Visual problems- no Medications Compliance- good Hypoglycemic symptoms- no   HYPERLIPIDEMIA Disease Monitoring See symptoms for Hypertension Medications Compliance- good RUQ pain- no  Muscle aches- no  ROS See HPI above   PMH Smoking Status noted  Past Medical History  Diagnosis Date  . Anxiety   . Diabetes mellitus   . Hypercholesterolemia   . Hypertension   . Hypothyroidism   . Lumbar pain   . Herniated disc    Family History  Problem Relation Age of Onset  . Diabetes Mother   . Hyperlipidemia Father   . Hypertension Father   . Stroke Father 9    quadruple bypass  . Heart disease Father     quadruple bypass  . Diabetes Brother   . Hypertension Brother   . Hyperlipidemia Brother    History   Social History  . Marital Status: Single    Spouse Name: N/A    Number of Children: N/A  . Years of Education: N/A   Occupational History  . Not on file.   Social History Main Topics  . Smoking status: Never Smoker   . Smokeless tobacco: Not on file  . Alcohol Use: Yes  . Drug Use: No  . Sexually Active: Not on file   Other Topics Concern  . Not on file   Social History Narrative  . No narrative on file      Review of Systems Pt saw retina specialist this summer ---Dr Darlin Coco retinopathy    Objective:   Physical Exam  Constitutional: She is oriented to person, place, and time. She appears well-developed and well-nourished.  Neck: Normal range of motion. Neck supple.  Cardiovascular: Normal rate, regular rhythm and normal heart sounds.   No murmur  heard. Pulmonary/Chest: Breath sounds normal.  Musculoskeletal: She exhibits no edema and no tenderness.  Neurological: She is alert and oriented to person, place, and time.  Psychiatric: She has a normal mood and affect. Her behavior is normal.  Sensory exam of the foot is normal, tested with the monofilament. Good pulses, no lesions or ulcers, good peripheral pulses.         Assessment & Plan:

## 2010-11-17 NOTE — Patient Instructions (Signed)

## 2010-11-17 NOTE — Assessment & Plan Note (Signed)
On no meds Pt working on diet and exercise Recheck labs next month

## 2010-11-17 NOTE — Assessment & Plan Note (Signed)
Controlled hgba1c 5.0 rto for labs in November

## 2010-12-23 ENCOUNTER — Ambulatory Visit (INDEPENDENT_AMBULATORY_CARE_PROVIDER_SITE_OTHER): Payer: Medicare Other | Admitting: Family Medicine

## 2010-12-23 ENCOUNTER — Ambulatory Visit: Payer: Medicare Other | Admitting: Cardiovascular Disease

## 2010-12-23 ENCOUNTER — Encounter: Payer: Self-pay | Admitting: Family Medicine

## 2010-12-23 VITALS — BP 122/80 | HR 66 | Temp 97.9°F | Ht 63.5 in | Wt 195.4 lb

## 2010-12-23 DIAGNOSIS — E119 Type 2 diabetes mellitus without complications: Secondary | ICD-10-CM

## 2010-12-23 DIAGNOSIS — I10 Essential (primary) hypertension: Secondary | ICD-10-CM

## 2010-12-23 DIAGNOSIS — E785 Hyperlipidemia, unspecified: Secondary | ICD-10-CM

## 2010-12-23 DIAGNOSIS — H6120 Impacted cerumen, unspecified ear: Secondary | ICD-10-CM

## 2010-12-23 LAB — MICROALBUMIN / CREATININE URINE RATIO
Creatinine,U: 33.8 mg/dL
Microalb Creat Ratio: 1.5 mg/g (ref 0.0–30.0)
Microalb, Ur: 0.5 mg/dL (ref 0.0–1.9)

## 2010-12-23 LAB — BASIC METABOLIC PANEL
Calcium: 8.9 mg/dL (ref 8.4–10.5)
Creatinine, Ser: 0.7 mg/dL (ref 0.4–1.2)
Sodium: 133 mEq/L — ABNORMAL LOW (ref 135–145)

## 2010-12-23 LAB — HEPATIC FUNCTION PANEL
ALT: 19 U/L (ref 0–35)
AST: 22 U/L (ref 0–37)
Alkaline Phosphatase: 60 U/L (ref 39–117)
Bilirubin, Direct: 0 mg/dL (ref 0.0–0.3)
Total Protein: 7.3 g/dL (ref 6.0–8.3)

## 2010-12-23 LAB — HEMOGLOBIN A1C: Hgb A1c MFr Bld: 5.4 % (ref 4.6–6.5)

## 2010-12-23 LAB — LDL CHOLESTEROL, DIRECT: Direct LDL: 169.8 mg/dL

## 2010-12-23 NOTE — Progress Notes (Signed)
  Subjective:    Patient ID: Kelly Elliott, female    DOB: 1940/04/27, 70 y.o.   MRN: [id number]  HPI  Pt here c/o ears feeling full.  No pain,  No Q tips.  No other symptoms.   Pt has been using Debrox for 5 days. Review of Systems As above    Objective:   Physical Exam  Constitutional: She is oriented to person, place, and time. She appears well-developed and well-nourished.  HENT:       B/l cerumen impaction-- irrigated both---good results on R--TMI,  No errythema.    Unable to get all out on L  Neurological: She is alert and oriented to person, place, and time.  Psychiatric: She has a normal mood and affect. Her behavior is normal.          Assessment & Plan:  Cerumen impaction----  con't use debrox on Left                                        rto 2-3 days

## 2010-12-23 NOTE — Patient Instructions (Signed)
Cerumen Impaction A cerumen impaction is when the wax in your ear forms a plug. This plug usually causes reduced hearing. Sometimes it also causes an earache or dizziness. Removing a cerumen impaction can be difficult and painful. The wax sticks to the ear canal. The canal is sensitive and bleeds easily. If you try to remove a heavy wax buildup with a cotton tipped swab, you may push it in further. Irrigation with water, suction, and small ear curettes may be used to clear out the wax. If the impaction is fixed to the skin in the ear canal, ear drops may be needed for a few days to loosen the wax. People who build up a lot of wax frequently can use ear wax removal products available in your local drugstore. SEEK MEDICAL CARE IF:  You develop an earache, increased hearing loss, or marked dizziness. Document Released: 03/05/2004 Document Revised: 10/08/2010 Document Reviewed: 04/25/2009 Exit[employer] Patient Information 2012 [employer]. 

## 2010-12-26 ENCOUNTER — Ambulatory Visit (INDEPENDENT_AMBULATORY_CARE_PROVIDER_SITE_OTHER): Payer: Medicare Other | Admitting: Family Medicine

## 2010-12-26 ENCOUNTER — Encounter: Payer: Self-pay | Admitting: Family Medicine

## 2010-12-26 VITALS — BP 126/84 | HR 74 | Temp 98.4°F | Wt 195.0 lb

## 2010-12-26 DIAGNOSIS — I10 Essential (primary) hypertension: Secondary | ICD-10-CM

## 2010-12-26 DIAGNOSIS — H6120 Impacted cerumen, unspecified ear: Secondary | ICD-10-CM

## 2010-12-26 DIAGNOSIS — E119 Type 2 diabetes mellitus without complications: Secondary | ICD-10-CM

## 2010-12-26 DIAGNOSIS — E785 Hyperlipidemia, unspecified: Secondary | ICD-10-CM

## 2010-12-26 MED ORDER — ATORVASTATIN CALCIUM 20 MG PO TABS: 20.0000 mg | ORAL_TABLET | Freq: Every day | ORAL | Status: DC

## 2010-12-26 NOTE — Progress Notes (Signed)
  Subjective:    Patient ID: Kelly Elliott, female    DOB: 1940-10-03, 70 y.o.   MRN: [id number]  HPI Pt here to irrigate L ear and go over labs.   No complaints.    Review of Systems As above    Objective:   Physical Exam  Constitutional: She is oriented to person, place, and time. She appears well-developed and well-nourished.  HENT:  Head: Normocephalic and atraumatic.  Right Ear: External ear normal.  Left Ear: External ear normal.  Nose: Nose normal.  Mouth/Throat: Oropharynx is clear and moist. No oropharyngeal exudate.  Pulmonary/Chest: Effort normal and breath sounds normal.  Neurological: She is alert and oriented to person, place, and time.  Psychiatric: She has a normal mood and affect. Her behavior is normal.          Assessment & Plan:  Cerumen impaction---- Left ear resolved with debrox Hyperlipidemia----labs reviewed with pt ---she will try lipitor again with CoQ10                                  Recheck in 3 months DM--controlled htn--stable

## 2010-12-26 NOTE — Patient Instructions (Signed)
Cerumen Impaction A cerumen impaction is when the wax in your ear forms a plug. This plug usually causes reduced hearing. Sometimes it also causes an earache or dizziness. Removing a cerumen impaction can be difficult and painful. The wax sticks to the ear canal. The canal is sensitive and bleeds easily. If you try to remove a heavy wax buildup with a cotton tipped swab, you may push it in further. Irrigation with water, suction, and small ear curettes may be used to clear out the wax. If the impaction is fixed to the skin in the ear canal, ear drops may be needed for a few days to loosen the wax. People who build up a lot of wax frequently can use ear wax removal products available in your local drugstore. SEEK MEDICAL CARE IF:  You develop an earache, increased hearing loss, or marked dizziness. Document Released: 03/05/2004 Document Revised: 10/08/2010 Document Reviewed: 04/25/2009 Exit[employer] Patient Information 2012 [employer]. 

## 2011-01-02 ENCOUNTER — Encounter: Payer: Self-pay | Admitting: Cardiovascular Disease

## 2011-01-02 ENCOUNTER — Ambulatory Visit (INDEPENDENT_AMBULATORY_CARE_PROVIDER_SITE_OTHER): Payer: Medicare Other | Admitting: Cardiovascular Disease

## 2011-01-02 DIAGNOSIS — E7800 Pure hypercholesterolemia, unspecified: Secondary | ICD-10-CM

## 2011-01-02 DIAGNOSIS — I10 Essential (primary) hypertension: Secondary | ICD-10-CM

## 2011-01-02 DIAGNOSIS — E119 Type 2 diabetes mellitus without complications: Secondary | ICD-10-CM

## 2011-01-02 NOTE — Progress Notes (Signed)
70 yo initially referred by Dr Arlyss Gandy and [facility] outpatient anesthesia for preoperative clearance on 05/07/10  She broke her right fibula and tore ligaments Casted and had uneventful surgery 05/09/10. Started on Bystolic 5mg  prior to. CRF's HTN and type 2 DM. No previoius anesthetic or surgical problems. Prior to fall activity limited by arthritis but no angina, history of cardiac problems, no palpitatons,dyspnea or edema. ECG reviewed from Dr Cammie Mcgee office essentially normal with nonspecific T wave changes. ECG in our office today was normal. Had meniscal tear on left knee in July Rx conservatively.   BP is labile. Stopped bystolic on her own and now on somewhat unusual dosing of lisinopril. She did not likel Hyzaar and restarted her lisinopril at 60 mg   Now sees Dr Laury Axon for primary care and likes her  ROS: Denies fever, malais, weight loss, blurry vision, decreased visual acuity, cough, sputum, SOB, hemoptysis, pleuritic pain, palpitaitons, heartburn, abdominal pain, melena, lower extremity edema, claudication, or rash.  All other systems reviewed and negative  General: Affect appropriate Healthy:  appears stated age HEENT: normal Neck supple with no adenopathy JVP normal no bruits no thyromegaly Lungs clear with no wheezing and good diaphragmatic motion Heart:  S1/S2 no murmur,rub, gallop or click PMI normal Abdomen: benighn, BS positve, no tenderness, no AAA no bruit.  No HSM or HJR Distal pulses intact with no bruits No edema Neuro non-focal Skin warm and dry No muscular weakness Right fibula surgery with plate   Current Outpatient Prescriptions  Medication Sig Dispense Refill  . Ascorbic Acid (VITAMIN C) 500 MG tablet Take 500 mg by mouth daily.        Marland Kitchen aspirin 81 MG tablet Take 81 mg by mouth daily.        Marland Kitchen atorvastatin (LIPITOR) 20 MG tablet Take 1 tablet (20 mg total) by mouth daily.  30 tablet  11  . busPIRone (BUSPAR) 30 MG tablet Take 30 mg by mouth 2 (two) times  daily.        . Calcium Carbonate (CALTRATE 600 PO) Take 1 tablet by mouth daily.        . cholecalciferol (VITAMIN D) 1000 UNITS tablet Take 1,000 Units by mouth daily.        Marland Kitchen glipiZIDE (GLUCOTROL) 2.5 mg TABS Take by mouth daily before breakfast.        . levothyroxine (SYNTHROID, LEVOTHROID) 88 MCG tablet Take 88 mcg by mouth daily.        Marland Kitchen LORazepam (ATIVAN) 1 MG tablet Take 1 mg by mouth as needed.        . zolpidem (AMBIEN) 10 MG tablet Take 10 mg by mouth at bedtime as needed.        Marland Kitchen lisinopril (PRINIVIL,ZESTRIL) 20 MG tablet TWO TABLETS EVERY AM AND ONE TABLET EVERY PM  90 tablet  12    Allergies  Fluorescein and Menthol  Electrocardiogram:  Assessment and Plan

## 2011-01-02 NOTE — Patient Instructions (Signed)
Your physician wants you to follow-up in: YEAR WITH DR NISHAN  You will receive a reminder letter in the mail two months in advance. If you don't receive a letter, please call our office to schedule the follow-up appointment.  Your physician recommends that you continue on your current medications as directed. Please refer to the Current Medication list given to you today. 

## 2011-01-02 NOTE — Assessment & Plan Note (Signed)
Discussed low carb diet.  Target hemoglobin A1c is 6.5 or less.  Continue current medications.  

## 2011-01-02 NOTE — Assessment & Plan Note (Signed)
Well controlled.  Continue current medications and low sodium Dash type diet.    

## 2011-01-02 NOTE — Assessment & Plan Note (Signed)
Has had myalgias with statins  Currently tolerating lipitor and taking CoEnzyme Q.  At LDL of over 160 suggested that she try to stay on statin

## 2011-01-05 ENCOUNTER — Other Ambulatory Visit: Payer: Self-pay | Admitting: Family Medicine

## 2011-01-05 MED ORDER — BUSPIRONE HCL 30 MG PO TABS: 30.0000 mg | ORAL_TABLET | Freq: Two times a day (BID) | ORAL | Status: DC

## 2011-01-05 NOTE — Telephone Encounter (Signed)
Rx faxed.    KP 

## 2011-02-26 ENCOUNTER — Other Ambulatory Visit: Payer: Self-pay | Admitting: Family Medicine

## 2011-02-26 MED ORDER — LORAZEPAM 1 MG PO TABS: 1.0000 mg | ORAL_TABLET | ORAL | Status: DC | PRN

## 2011-02-26 MED ORDER — ZOLPIDEM TARTRATE 10 MG PO TABS: 10.0000 mg | ORAL_TABLET | Freq: Every evening | ORAL | Status: DC | PRN

## 2011-02-26 NOTE — Telephone Encounter (Signed)
Last seen 12/26/10 and never filled on this system. Please advise     KP

## 2011-03-23 ENCOUNTER — Other Ambulatory Visit: Payer: Self-pay | Admitting: *Deleted

## 2011-03-23 MED ORDER — ZOLPIDEM TARTRATE 10 MG PO TABS: 10.0000 mg | ORAL_TABLET | Freq: Every evening | ORAL | Status: DC | PRN

## 2011-03-23 NOTE — Telephone Encounter (Signed)
Rx printed and faxed, msg left for a return call    KP

## 2011-03-23 NOTE — Telephone Encounter (Signed)
She stated she has never had any reaction but having increased stress and hard for her to go back to sleep she is not having any side effects. She will discuss at her upcoming apt.     KP

## 2011-03-23 NOTE — Telephone Encounter (Signed)
i don't put refills on ambien and it is not good to take it every night---has she ever had a w/u to see why she is not sleeping at night?   Refill x 1 but we need to discuss this at f/u ov

## 2011-03-23 NOTE — Telephone Encounter (Signed)
Pt is requesting a refill on ambien with refills. Last OV 12-26-10, last Filled 02-26-11 #30

## 2011-04-16 ENCOUNTER — Telehealth: Payer: Self-pay | Admitting: Family Medicine

## 2011-04-16 ENCOUNTER — Encounter: Payer: Self-pay | Admitting: Family Medicine

## 2011-04-16 ENCOUNTER — Ambulatory Visit (INDEPENDENT_AMBULATORY_CARE_PROVIDER_SITE_OTHER): Payer: Medicare Other | Admitting: Family Medicine

## 2011-04-16 ENCOUNTER — Other Ambulatory Visit (INDEPENDENT_AMBULATORY_CARE_PROVIDER_SITE_OTHER): Payer: Medicare Other

## 2011-04-16 VITALS — BP 124/72 | HR 72 | Temp 97.5°F | Wt 200.4 lb

## 2011-04-16 DIAGNOSIS — E7800 Pure hypercholesterolemia, unspecified: Secondary | ICD-10-CM

## 2011-04-16 DIAGNOSIS — E119 Type 2 diabetes mellitus without complications: Secondary | ICD-10-CM

## 2011-04-16 DIAGNOSIS — R21 Rash and other nonspecific skin eruption: Secondary | ICD-10-CM

## 2011-04-16 DIAGNOSIS — E039 Hypothyroidism, unspecified: Secondary | ICD-10-CM

## 2011-04-16 DIAGNOSIS — Z0000 Encounter for general adult medical examination without abnormal findings: Secondary | ICD-10-CM

## 2011-04-16 DIAGNOSIS — M791 Myalgia, unspecified site: Secondary | ICD-10-CM

## 2011-04-16 DIAGNOSIS — E785 Hyperlipidemia, unspecified: Secondary | ICD-10-CM

## 2011-04-16 DIAGNOSIS — F411 Generalized anxiety disorder: Secondary | ICD-10-CM

## 2011-04-16 DIAGNOSIS — F419 Anxiety disorder, unspecified: Secondary | ICD-10-CM

## 2011-04-16 DIAGNOSIS — G4700 Insomnia, unspecified: Secondary | ICD-10-CM

## 2011-04-16 DIAGNOSIS — I10 Essential (primary) hypertension: Secondary | ICD-10-CM

## 2011-04-16 LAB — HEPATIC FUNCTION PANEL
ALT: 17 U/L (ref 0–35)
AST: 22 U/L (ref 0–37)
Alkaline Phosphatase: 67 U/L (ref 39–117)
Bilirubin, Direct: 0.1 mg/dL (ref 0.0–0.3)
Total Protein: 7.3 g/dL (ref 6.0–8.3)

## 2011-04-16 LAB — LIPID PANEL
Cholesterol: 218 mg/dL — ABNORMAL HIGH (ref 0–200)
HDL: 71.7 mg/dL (ref >39.00)
Triglycerides: 102 mg/dL (ref 0.0–149.0)
VLDL: 20.4 mg/dL (ref 0.0–40.0)

## 2011-04-16 LAB — BASIC METABOLIC PANEL
BUN: 19 mg/dL (ref 6–23)
Creatinine, Ser: 0.8 mg/dL (ref 0.4–1.2)
GFR: 80.31 mL/min (ref >60.00)

## 2011-04-16 LAB — CK: Total CK: 91 U/L (ref 7–177)

## 2011-04-16 LAB — MICROALBUMIN / CREATININE URINE RATIO: Microalb, Ur: 0.8 mg/dL (ref 0.0–1.9)

## 2011-04-16 MED ORDER — ZOLPIDEM TARTRATE 5 MG PO TABS: ORAL_TABLET | ORAL | Status: DC

## 2011-04-16 MED ORDER — LEVOTHYROXINE SODIUM 88 MCG PO TABS: 88.0000 ug | ORAL_TABLET | Freq: Every day | ORAL | Status: DC

## 2011-04-16 MED ORDER — LORAZEPAM 1 MG PO TABS: 1.0000 mg | ORAL_TABLET | ORAL | Status: DC | PRN

## 2011-04-16 NOTE — Assessment & Plan Note (Signed)
Refill meds stable 

## 2011-04-16 NOTE — Telephone Encounter (Signed)
Refill: Zolidepam tartrate 10 mg. Last fill 03-23-11

## 2011-04-16 NOTE — Assessment & Plan Note (Addendum)
Controlled, cont meds

## 2011-04-16 NOTE — Assessment & Plan Note (Signed)
Check labs 

## 2011-04-16 NOTE — Patient Instructions (Signed)

## 2011-04-16 NOTE — Progress Notes (Signed)
  Subjective:    Patient ID: Kelly Elliott, female    DOB: 03/01/1940, 71 y.o.   MRN: [id number]  HPI Pt here c/o blisters on bottom of feet that the nail technician noticed when she was getting a pedicure.  The don't itch or hurt.  The don't bother her at all.        Review of Systems As above    Objective:   Physical Exam  Constitutional: She is oriented to person, place, and time. She appears well-developed and well-nourished.  Neck: Normal range of motion. Neck supple.  Cardiovascular: Normal rate and regular rhythm.   No murmur heard. Pulmonary/Chest: Effort normal and breath sounds normal. No respiratory distress. She has no wheezes. She has no rales. She exhibits no tenderness.  Neurological: She is alert and oriented to person, place, and time.  Skin:       ? Blisters bottom several toes on both feet  Psychiatric: She has a normal mood and affect. Her behavior is normal. Judgment and thought content normal.   Sensory exam of the foot is normal, tested with the monofilament. Good pulses, no lesions or ulcers, good peripheral pulses.        Assessment & Plan:  ? Blisters on feet--- ? Etiology,  Refer to derm

## 2011-04-16 NOTE — Telephone Encounter (Signed)
Rx given to patient at her Office visit.    KP

## 2011-04-23 ENCOUNTER — Telehealth: Payer: Self-pay | Admitting: Family Medicine

## 2011-04-23 NOTE — Telephone Encounter (Signed)
Discussed with patient    KP 

## 2011-04-23 NOTE — Telephone Encounter (Signed)
Patient calling, asking for results of recent labs, states not available on "my chart".  Also wants Dr. Laury Axon to know she did see Podiatry, a biopsy was taken, but results are still unknown as of today.

## 2011-05-18 ENCOUNTER — Encounter: Payer: Self-pay | Admitting: Family Medicine

## 2011-05-19 ENCOUNTER — Other Ambulatory Visit: Payer: Self-pay | Admitting: Family Medicine

## 2011-05-19 DIAGNOSIS — G4700 Insomnia, unspecified: Secondary | ICD-10-CM

## 2011-05-19 NOTE — Telephone Encounter (Signed)
Refill for  Zolpidem Tartrate 5MG    Last qty written 30  Last date written 3.7.13 Last instruction written  1 po qhs prn

## 2011-05-19 NOTE — Telephone Encounter (Signed)
Refill x1--  Reminder to not take every night--no refills

## 2011-05-19 NOTE — Telephone Encounter (Signed)
Last seen and filled 04/16/11 #30. Please advise     KP

## 2011-05-21 MED ORDER — ZOLPIDEM TARTRATE 5 MG PO TABS: ORAL_TABLET | ORAL | Status: DC

## 2011-05-21 NOTE — Telephone Encounter (Signed)
Discuss with patient, Rx sent. 

## 2011-05-22 ENCOUNTER — Encounter: Payer: Self-pay | Admitting: Internal Medicine

## 2011-06-05 ENCOUNTER — Telehealth: Payer: Self-pay | Admitting: Family Medicine

## 2011-06-05 DIAGNOSIS — E785 Hyperlipidemia, unspecified: Secondary | ICD-10-CM

## 2011-06-05 DIAGNOSIS — E039 Hypothyroidism, unspecified: Secondary | ICD-10-CM

## 2011-06-05 MED ORDER — ATORVASTATIN CALCIUM 20 MG PO TABS: 20.0000 mg | ORAL_TABLET | Freq: Every day | ORAL | Status: DC

## 2011-06-05 MED ORDER — LEVOTHYROXINE SODIUM 88 MCG PO TABS: 88.0000 ug | ORAL_TABLET | Freq: Every day | ORAL | Status: DC

## 2011-06-05 MED ORDER — BUSPIRONE HCL 30 MG PO TABS: 30.0000 mg | ORAL_TABLET | Freq: Two times a day (BID) | ORAL | Status: DC

## 2011-06-05 NOTE — Telephone Encounter (Signed)
Refill: Busiprone hcl 30mg  tab #60. Take one tablet by mouth two times daily. Last fill 05-05-11 Lipitor 40mg  tab #30. Take one tablet by mouth every day. Levothyroxine sodium tab #30. Take one tablet by mouth every morning on an empty stomach. Last fill 05-05-11.

## 2011-07-08 ENCOUNTER — Ambulatory Visit (AMBULATORY_SURGERY_CENTER): Payer: Medicare Other | Admitting: *Deleted

## 2011-07-08 VITALS — Ht 64.0 in | Wt 201.0 lb

## 2011-07-08 DIAGNOSIS — Z1211 Encounter for screening for malignant neoplasm of colon: Secondary | ICD-10-CM

## 2011-07-08 MED ORDER — PEG-KCL-NACL-NASULF-NA ASC-C 100 G PO SOLR: 1.0000 | Freq: Once | ORAL | Status: DC

## 2011-07-14 IMAGING — CR DG MYELOGRAM LUMBAR
3 series · 3 of 3 positions shown · IV contrast (omnipaque)
Comparison: Outside MRI

CLINICAL DATA: Right leg weakness with some pain.

  MYELOGRAM INJECTION
TECHNIQUE: Informed consent was obtained from the patient prior to
the procedure, including potential complications of headache,
allergy, infection and pain.  A timeout procedure was performed.
With the patient prone, the lower back was prepped with Betadine.
1% Lidocaine was used for local anesthesia.  Lumbar puncture was
performed at the right L3-4 level using a 22 gauge needle with
return of clear CSF.  16 ml of Omnipaque 504was injected into the
subarachnoid space .
TECHNIQUE: Following injection of intrathecal Omnipaque contrast,
spine imaging in multiple projections was performed using
fluoroscopy.
Fluoroscopy Time: 1 minute 14 seconds .
TECHNIQUE: CT imaging of the lumbar spine was performed after
intrathecal contrast administration.  Multiplanar CT image
reconstructions were also generated.

[view not recorded (1 of 3)]
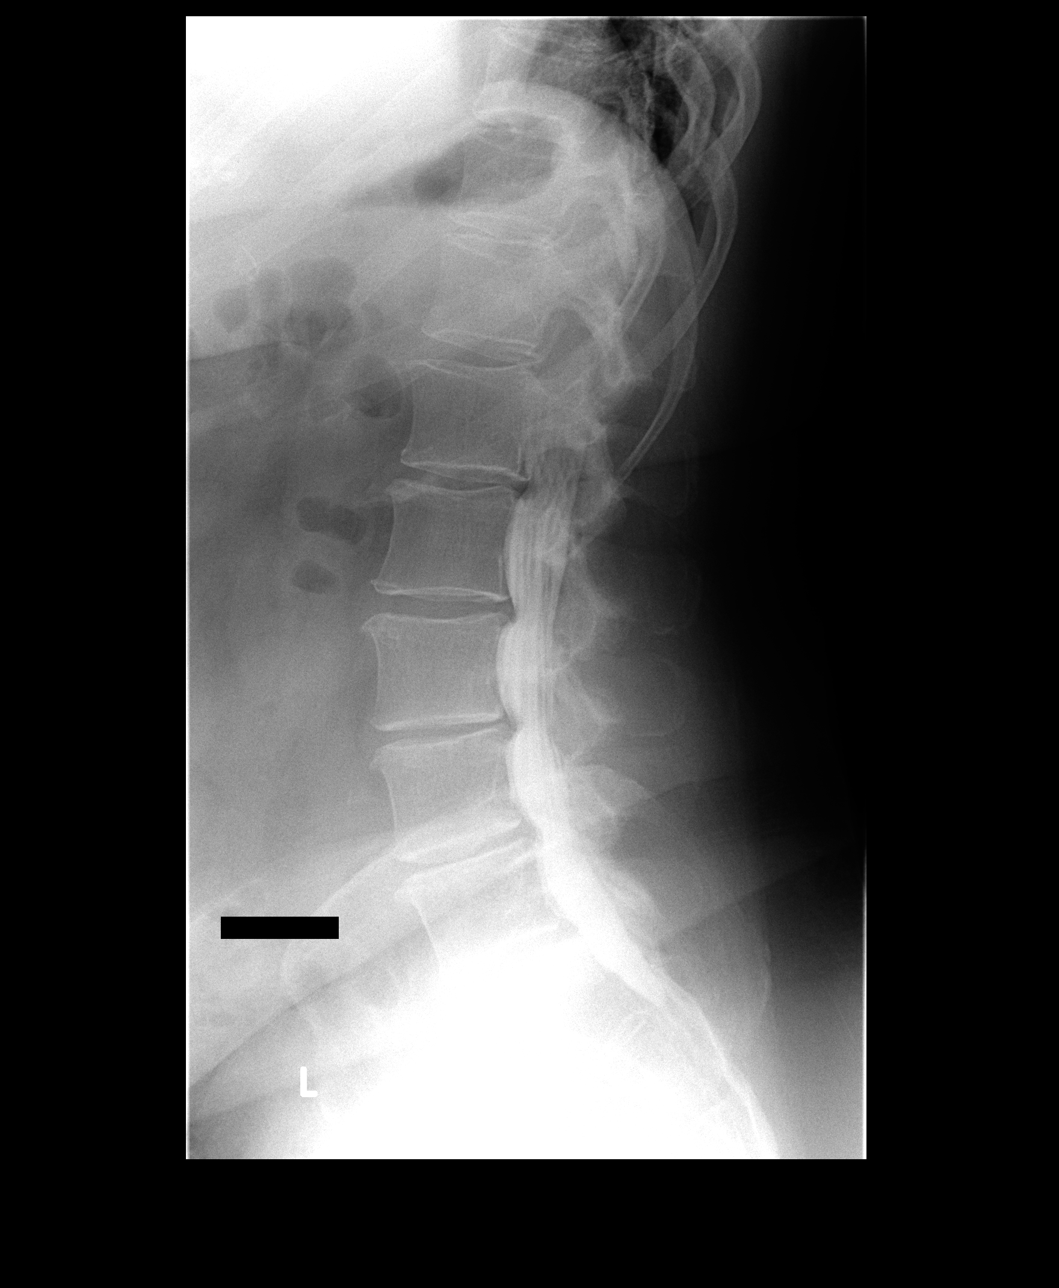

[view not recorded (2 of 3)]
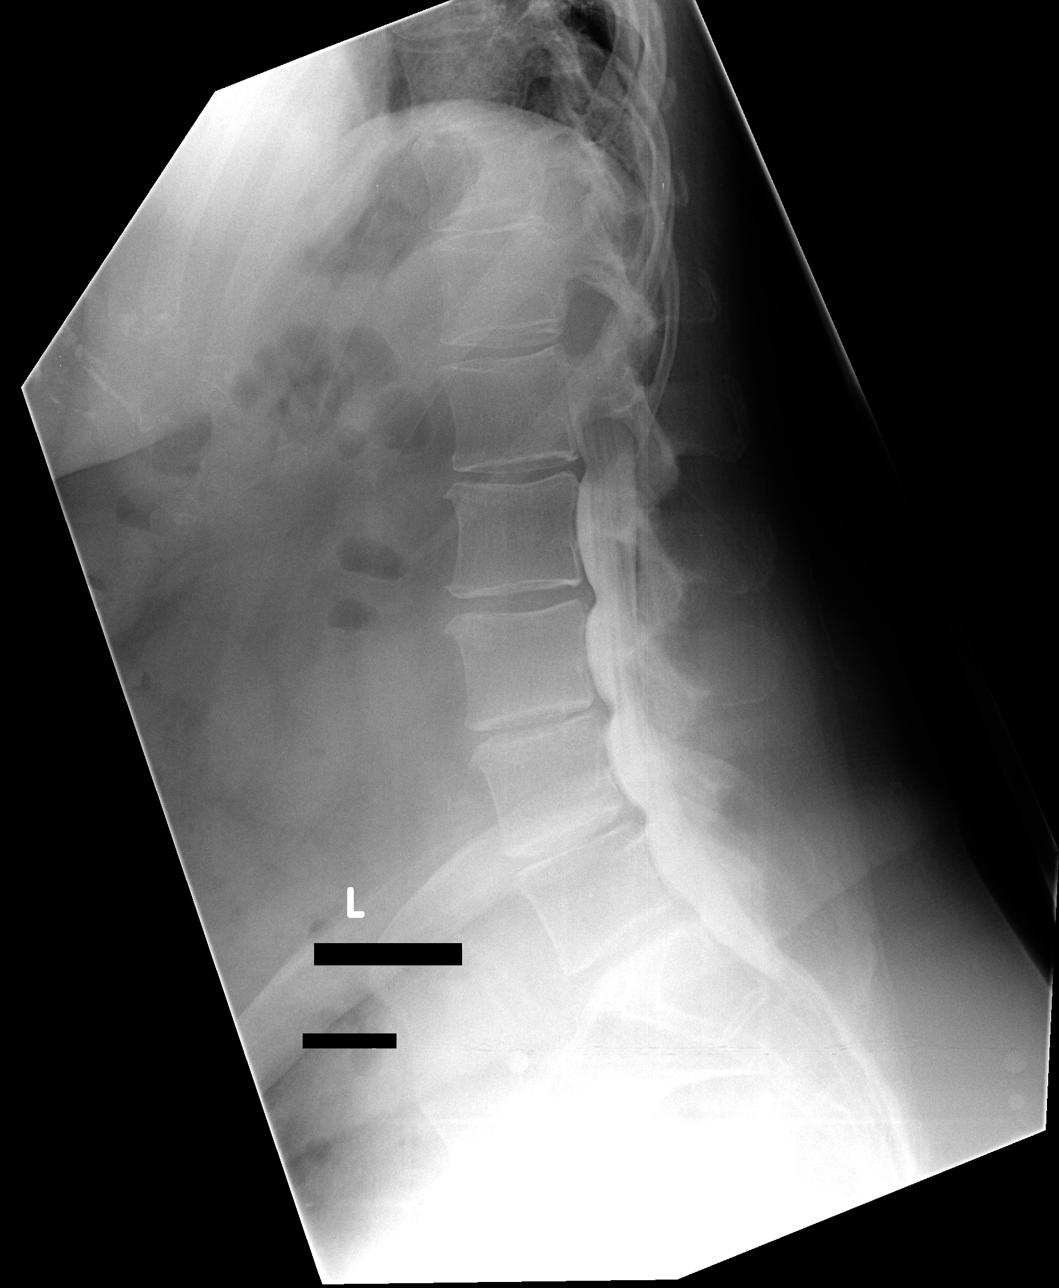

[view not recorded (3 of 3)]
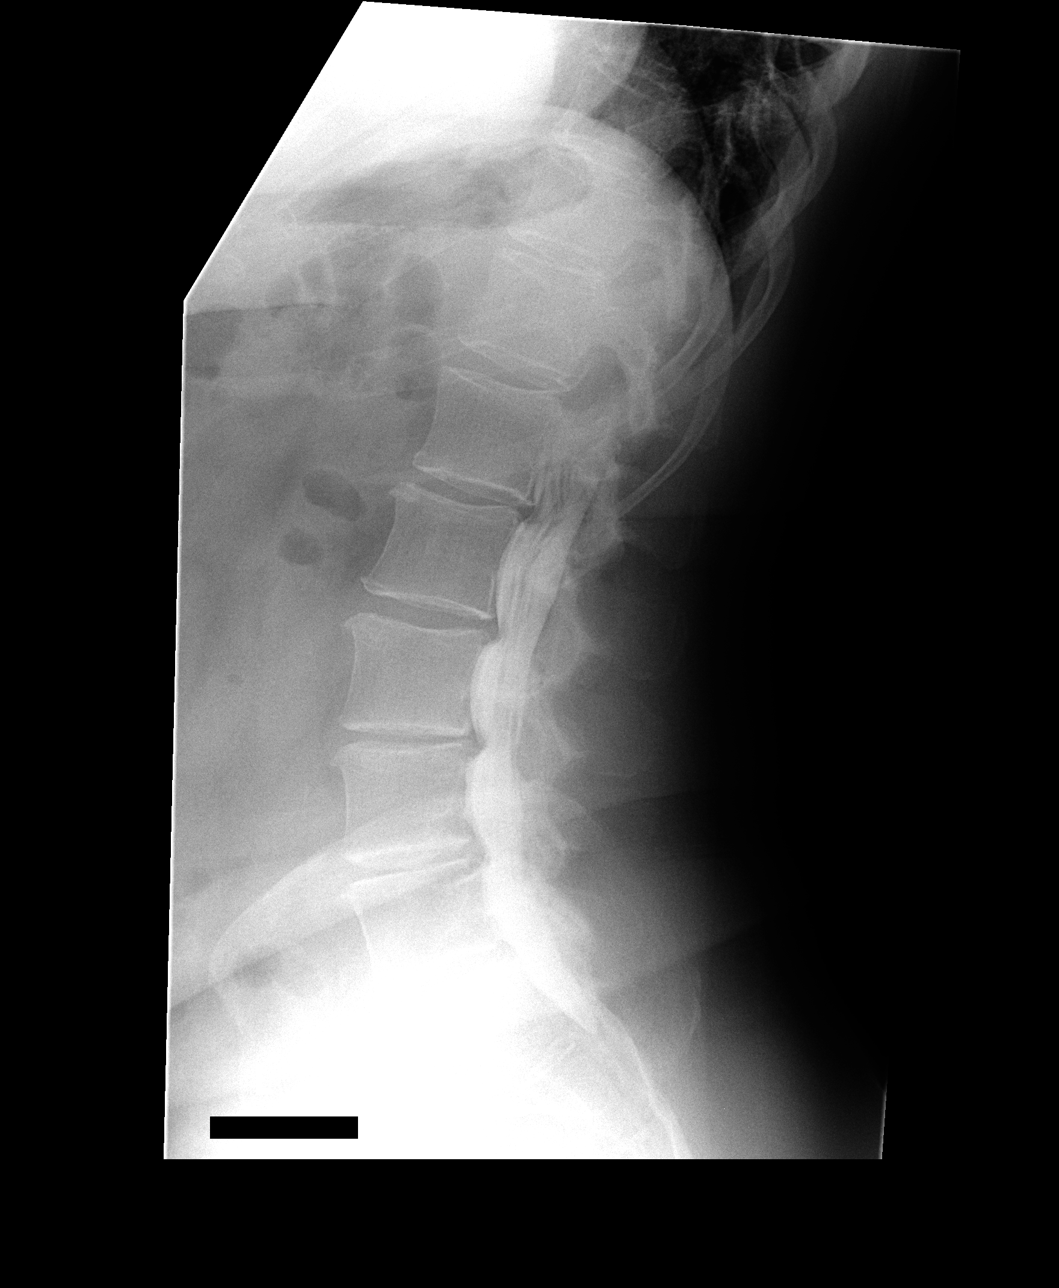

[3 of 3 positions shown; findings below may reference images not displayed]

IMPRESSION: Successful injection of  intrathecal contrast for myelography.

MYELOGRAM LUMBAR
FINDINGS: There are anterior extradural defects from L1-2 through
L5-S1.  Alignment is normal and no abnormal motion occurs with
standing flexion and extension.  There is mild narrowing of the
lateral recesses at L4-5, more on the right.  The right  L5 nerve
root appears slightly swollen compared to the left.
IMPRESSION: Anterior extradural defects throughout the lumbar region.  No
abnormal motion.

Narrowing of the lateral recesses L4-5.  Slight swelling of the
right L5 nerve root compared to the left.

CT MYELOGRAPHY LUMBAR SPINE
FINDINGS: 03/22/2010:  Normal interspace.  Conus tip at lower T12.

T12-L1:  Normal interspace.

L1-2:  The disc is degenerated and bulges mildly in a
circumferential fashion.  No compressive stenosis.

L2-3:  Disc is degenerated and bulges in a circumferential fashion.
This is focally prominent in the left foraminal region.  This could
conceivably effect the left L2 nerve root.  No right-sided
compressive disease suspected.

L3-4:  The disc is degenerated shows circumferential protrusion,
focally prominent in the left foraminal to extraforaminal region.
There is mild narrowing of the left lateral recess.  Left L3 nerve
root compression could occur.  No right-sided compressive
pathology.  There is facet degeneration at this level, more on the
left.

L4-5:  The disc is degenerated and bulges diffusely.  There is mild
ligamentous hypertrophy.  There is mild stenosis of both lateral
recesses.  Disc  material is slightly prominent in the right
extraforaminal region and it is possible that the right L4 nerve
root could be irritated in this location.

L5-S1:  Bulging of the disc.  No apparent compressive stenosis.
Facet degeneration without slippage or encroachment upon the neural
spaces.
IMPRESSION: L2-3:  Left foraminal disc bulge could irritate the left L2 nerve
root.  Definite compression not established.

L3-4:  Left foraminal to extra foraminal disc protrusion could
irritate the left L3 nerve root.  No right-sided compressive
pathology.

L4-5:  Bulging of the disc is slightly prominent in the right
extraforaminal region.  Mild ligamentous hypertrophy.  Mild
narrowing of the lateral recesses without definite L5 nerve root
compression.  There be some potential for the L4 nerve root could
be irritated in the foraminal to extraforaminal region.  Note that
the right L5 nerve root appeared slightly swollen compared to the
left at myelography.  This is not as well depicted at CT.

L5-S1:  Bulging of the disc.  Facet degeneration right worse than
left.  No apparent compressive stenosis.

## 2011-07-14 IMAGING — CT CT L SPINE W/ CM
3 of 11 series · 11 of 33 positions shown, 12 images · IV contrast (omnipaque)
Comparison: Outside MRI

CLINICAL DATA: Right leg weakness with some pain.

  MYELOGRAM INJECTION
TECHNIQUE: Informed consent was obtained from the patient prior to
the procedure, including potential complications of headache,
allergy, infection and pain.  A timeout procedure was performed.
With the patient prone, the lower back was prepped with Betadine.
1% Lidocaine was used for local anesthesia.  Lumbar puncture was
performed at the right L3-4 level using a 22 gauge needle with
return of clear CSF.  16 ml of Omnipaque 504was injected into the
subarachnoid space .
TECHNIQUE: Following injection of intrathecal Omnipaque contrast,
spine imaging in multiple projections was performed using
fluoroscopy.
Fluoroscopy Time: 1 minute 14 seconds .
TECHNIQUE: CT imaging of the lumbar spine was performed after
intrathecal contrast administration.  Multiplanar CT image
reconstructions were also generated.

[Series 2: l spine bone · axial · 0.27mm/px · z∈[-316,-96]mm · 3 of 89 slices shown, 4 images]
[im 1/89  soft-tissue]
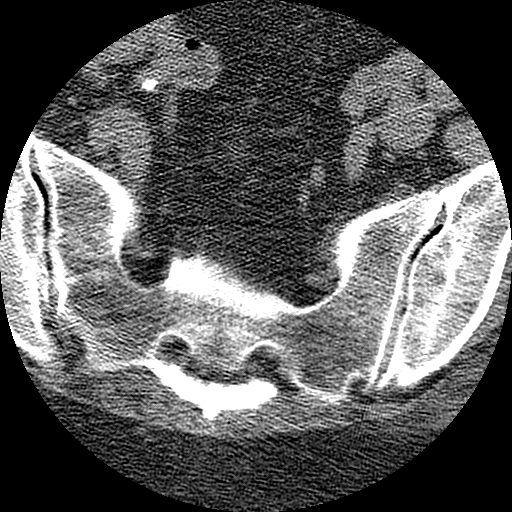
[im 1/89  bone]
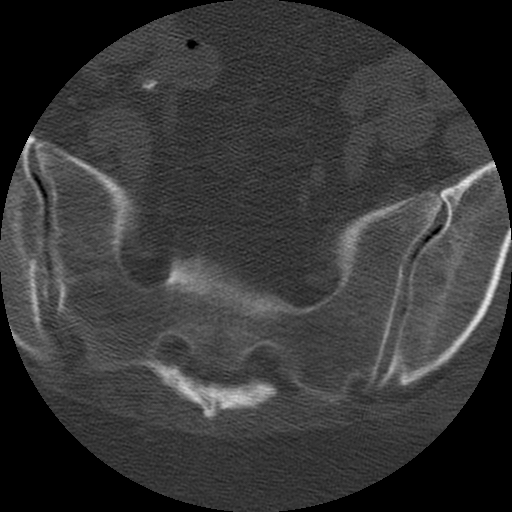
[im 45/89  bone]
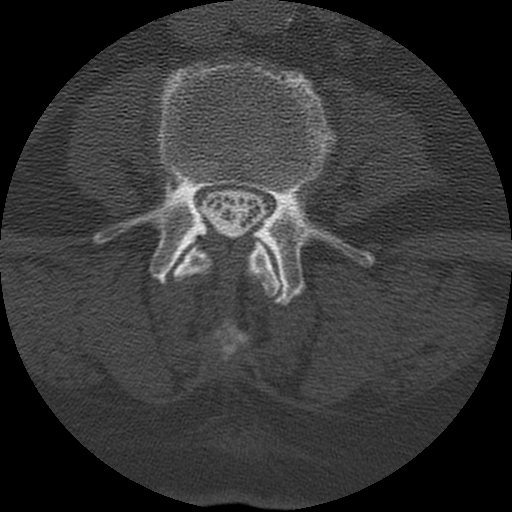
[im 89/89  bone]
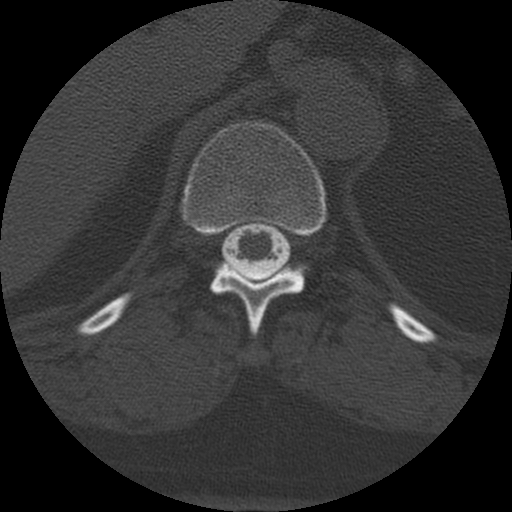

[Series 400: cor · coronal · 0.44mm/px · 5 of 43 slices shown]
[im 8/43  bone]
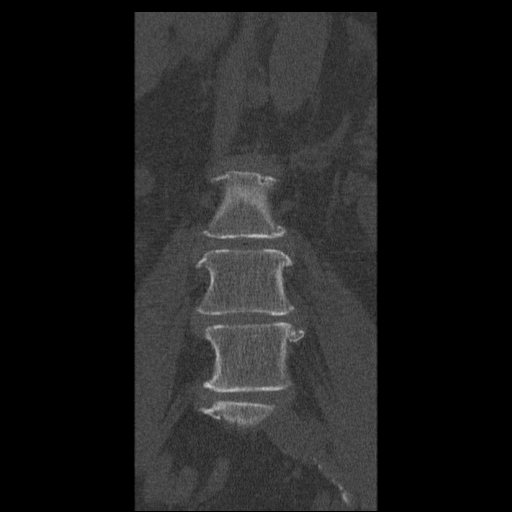
[im 15/43  bone]
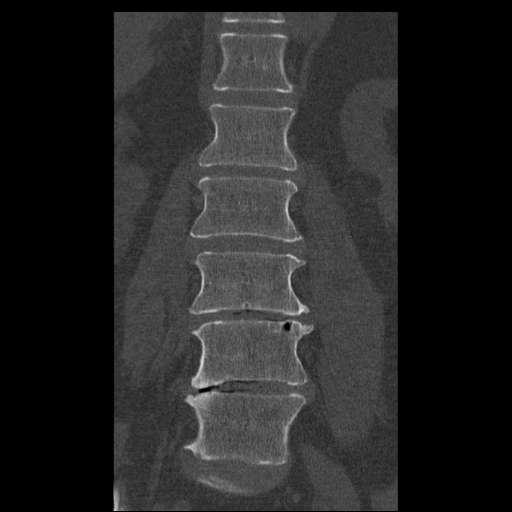
[im 22/43  bone]
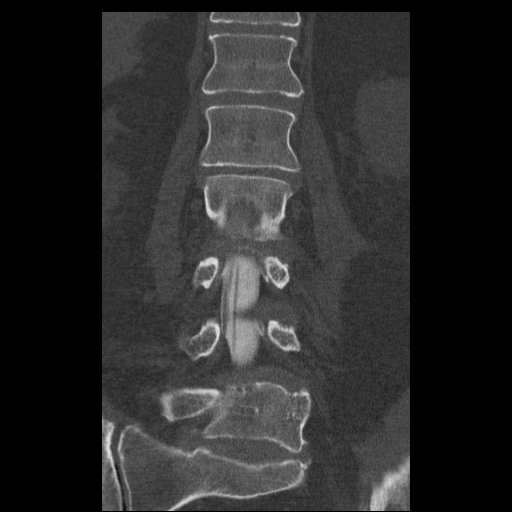
[im 29/43  bone]
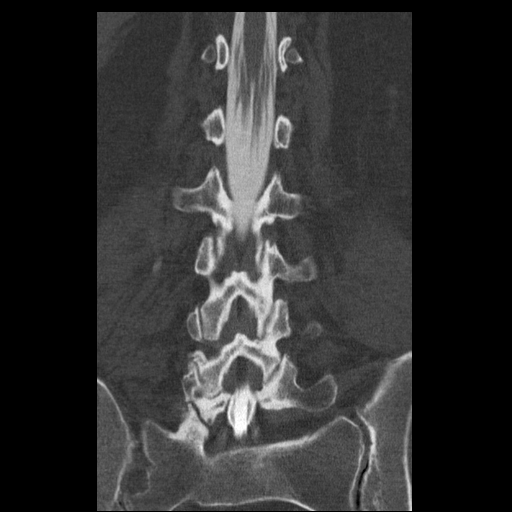
[im 36/43  bone]
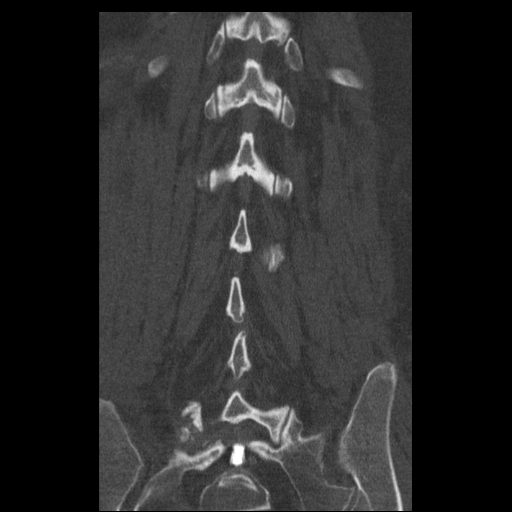

[Series 401: cor/lower level · coronal · 0.27mm/px · 3 of 43 slices shown]
[im 9/43  bone]
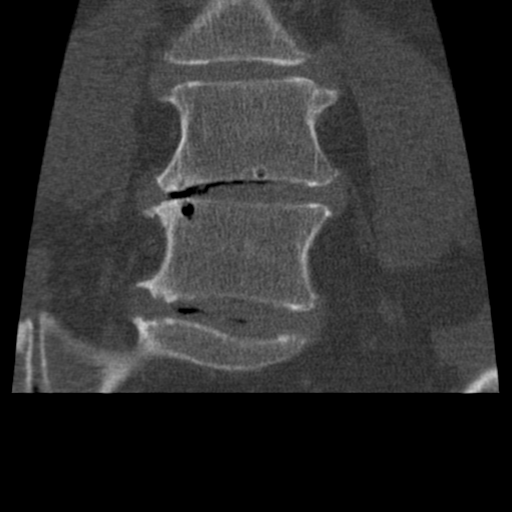
[im 17/43  bone]
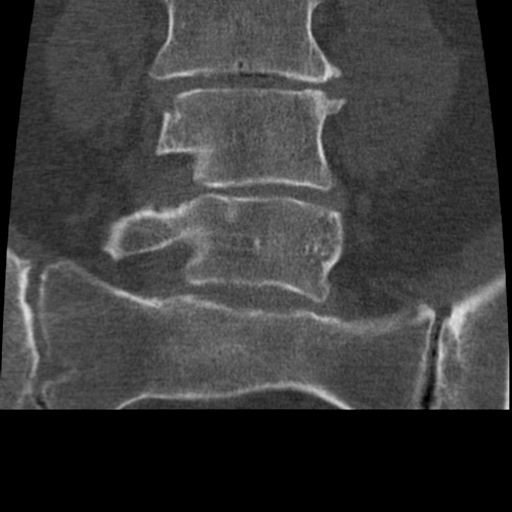
[im 26/43  bone]
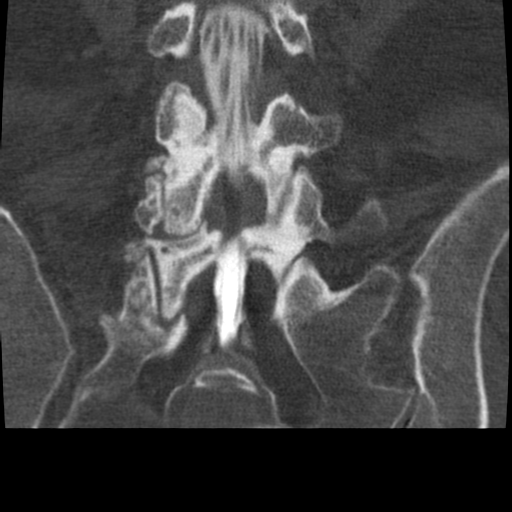

[11 of 33 positions shown; findings below may reference images not displayed]

IMPRESSION: Successful injection of  intrathecal contrast for myelography.

MYELOGRAM LUMBAR
FINDINGS: There are anterior extradural defects from L1-2 through
L5-S1.  Alignment is normal and no abnormal motion occurs with
standing flexion and extension.  There is mild narrowing of the
lateral recesses at L4-5, more on the right.  The right  L5 nerve
root appears slightly swollen compared to the left.
IMPRESSION: Anterior extradural defects throughout the lumbar region.  No
abnormal motion.

Narrowing of the lateral recesses L4-5.  Slight swelling of the
right L5 nerve root compared to the left.

CT MYELOGRAPHY LUMBAR SPINE
FINDINGS: 03/22/2010:  Normal interspace.  Conus tip at lower T12.

T12-L1:  Normal interspace.

L1-2:  The disc is degenerated and bulges mildly in a
circumferential fashion.  No compressive stenosis.

L2-3:  Disc is degenerated and bulges in a circumferential fashion.
This is focally prominent in the left foraminal region.  This could
conceivably effect the left L2 nerve root.  No right-sided
compressive disease suspected.

L3-4:  The disc is degenerated shows circumferential protrusion,
focally prominent in the left foraminal to extraforaminal region.
There is mild narrowing of the left lateral recess.  Left L3 nerve
root compression could occur.  No right-sided compressive
pathology.  There is facet degeneration at this level, more on the
left.

L4-5:  The disc is degenerated and bulges diffusely.  There is mild
ligamentous hypertrophy.  There is mild stenosis of both lateral
recesses.  Disc  material is slightly prominent in the right
extraforaminal region and it is possible that the right L4 nerve
root could be irritated in this location.

L5-S1:  Bulging of the disc.  No apparent compressive stenosis.
Facet degeneration without slippage or encroachment upon the neural
spaces.
IMPRESSION: L2-3:  Left foraminal disc bulge could irritate the left L2 nerve
root.  Definite compression not established.

L3-4:  Left foraminal to extra foraminal disc protrusion could
irritate the left L3 nerve root.  No right-sided compressive
pathology.

L4-5:  Bulging of the disc is slightly prominent in the right
extraforaminal region.  Mild ligamentous hypertrophy.  Mild
narrowing of the lateral recesses without definite L5 nerve root
compression.  There be some potential for the L4 nerve root could
be irritated in the foraminal to extraforaminal region.  Note that
the right L5 nerve root appeared slightly swollen compared to the
left at myelography.  This is not as well depicted at CT.

L5-S1:  Bulging of the disc.  Facet degeneration right worse than
left.  No apparent compressive stenosis.

## 2011-07-22 ENCOUNTER — Ambulatory Visit (AMBULATORY_SURGERY_CENTER): Payer: Medicare Other | Admitting: Internal Medicine

## 2011-07-22 ENCOUNTER — Encounter: Payer: Self-pay | Admitting: Internal Medicine

## 2011-07-22 VITALS — BP 167/79 | HR 83 | Temp 97.1°F | Resp 18 | Ht 64.0 in | Wt 201.0 lb

## 2011-07-22 DIAGNOSIS — D126 Benign neoplasm of colon, unspecified: Secondary | ICD-10-CM

## 2011-07-22 DIAGNOSIS — Z1211 Encounter for screening for malignant neoplasm of colon: Secondary | ICD-10-CM

## 2011-07-22 LAB — GLUCOSE, CAPILLARY
Glucose-Capillary: 97 mg/dL (ref 70–99)
Glucose-Capillary: 85 mg/dL (ref 70–99)

## 2011-07-22 MED ORDER — SODIUM CHLORIDE 0.9 % IV SOLN: 500.0000 mL | INTRAVENOUS | Status: DC

## 2011-07-22 NOTE — Op Note (Signed)
[facility] [address] [place], [place]  [address]  COLONOSCOPY PROCEDURE REPORT  PATIENT:  Kelly Elliott, Kelly Elliott  MR#:  [id number] BIRTHDATE:  10-Oct-1940, 70 yrs. old  GENDER:  female ENDOSCOPIST:  Hedwig Morton. Juanda Chance, MD REF. BY:  Loreen Freud, DO PROCEDURE DATE:  07/22/2011 PROCEDURE:  Colonoscopy with biopsy ASA CLASS:  Class II INDICATIONS:  history of polyps colonoscopy > 10 years ago, polyp found, records not available MEDICATIONS:   MAC sedation, administered by CRNA, propofol (Diprivan) 450 mg  DESCRIPTION OF PROCEDURE:   After the risks and benefits and of the procedure were explained, informed consent was obtained. Digital rectal exam was performed and revealed no rectal masses. The LB PCF-[id number] endoscope was introduced through the anus and advanced to the cecum, which was identified by both the appendix and ileocecal valve.  The quality of the prep was good, using MoviPrep.  The instrument was then slowly withdrawn as the colon was fully examined. <<PROCEDUREIMAGES>>  FINDINGS:  Two polyps were found. 3 mm and 4 mm sessile polyps in the cecum and at 20cm The polyps were removed using cold biopsy forceps (see image5).  Mild diverticulosis was found in the sigmoid colon (see image1).  This was otherwise a normal examination of the colon (see image2, image3, image4, and image6). Retroflexed views in the rectum revealed no abnormalities.    The scope was then withdrawn from the patient and the procedure completed.  COMPLICATIONS:  None ENDOSCOPIC IMPRESSION: 1) Two polyps 2) Mild diverticulosis in the sigmoid colon 3) Otherwise normal examination RECOMMENDATIONS: 1) Await pathology results 2) High fiber diet.  REPEAT EXAM:  In 10 year(s) for.  5 year recall if polyps are adenomatous  ______________________________ Hedwig Morton. Juanda Chance, MD  CC:  n. eSIGNED:   Hedwig Morton. Hopie Pellegrin at 07/22/2011 10:39 AM  Telford Nab, [id number]

## 2011-07-22 NOTE — Patient Instructions (Addendum)
Discharge instructions given with verbal understanding. Handouts on polyps and diverticulosis sealed in envelope along with procedure. Patient is HIPPA. Resume previous medications. YOU HAD AN ENDOSCOPIC PROCEDURE TODAY AT THE [facility]: Refer to the procedure report that was given to you for any specific questions about what was found during the examination.  If the procedure report does not answer your questions, please call your gastroenterologist to clarify.  If you requested that your care partner not be given the details of your procedure findings, then the procedure report has been included in a sealed envelope for you to review at your convenience later.  YOU SHOULD EXPECT: Some feelings of bloating in the abdomen. Passage of more gas than usual.  Walking can help get rid of the air that was put into your GI tract during the procedure and reduce the bloating. If you had a lower endoscopy (such as a colonoscopy or flexible sigmoidoscopy) you may notice spotting of blood in your stool or on the toilet paper. If you underwent a bowel prep for your procedure, then you may not have a normal bowel movement for a few days.  DIET: Your first meal following the procedure should be a light meal and then it is ok to progress to your normal diet.  A half-sandwich or bowl of soup is an example of a good first meal.  Heavy or fried foods are harder to digest and may make you feel nauseous or bloated.  Likewise meals heavy in dairy and vegetables can cause extra gas to form and this can also increase the bloating.  Drink plenty of fluids but you should avoid alcoholic beverages for 24 hours.  ACTIVITY: Your care partner should take you home directly after the procedure.  You should plan to take it easy, moving slowly for the rest of the day.  You can resume normal activity the day after the procedure however you should NOT DRIVE or use heavy machinery for 24 hours (because of the sedation medicines  used during the test).    SYMPTOMS TO REPORT IMMEDIATELY: A gastroenterologist can be reached at any hour.  During normal business hours, 8:30 AM to 5:00 PM Monday through Friday, call [phone].  After hours and on weekends, please call the GI answering service at [phone] who will take a message and have the physician on call contact you.   Following lower endoscopy (colonoscopy or flexible sigmoidoscopy):  Excessive amounts of blood in the stool  Significant tenderness or worsening of abdominal pains  Swelling of the abdomen that is new, acute  Fever of 100F or higher  FOLLOW UP: If any biopsies were taken you will be contacted by phone or by letter within the next 1-3 weeks.  Call your gastroenterologist if you have not heard about the biopsies in 3 weeks.  Our staff will call the home number listed on your records the next business day following your procedure to check on you and address any questions or concerns that you may have at that time regarding the information given to you following your procedure. This is a courtesy call and so if there is no answer at the home number and we have not heard from you through the emergency physician on call, we will assume that you have returned to your regular daily activities without incident.  SIGNATURES/CONFIDENTIALITY: You and/or your care partner have signed paperwork which will be entered into your electronic medical record.  These signatures attest to the fact that  that the information above on your After Visit Summary has been reviewed and is understood.  Full responsibility of the confidentiality of this discharge information lies with you and/or your care-partner.

## 2011-07-22 NOTE — Progress Notes (Signed)
Patient did not experience any of the following events: a burn prior to discharge; a fall within the facility; wrong site/side/patient/procedure/implant event; or a hospital transfer or hospital admission upon discharge from the facility. (G8907) Patient did not have preoperative order for IV antibiotic SSI prophylaxis. (G8918)  

## 2011-07-23 ENCOUNTER — Other Ambulatory Visit: Payer: Self-pay | Admitting: Family Medicine

## 2011-07-23 ENCOUNTER — Telehealth: Payer: Self-pay | Admitting: *Deleted

## 2011-07-23 DIAGNOSIS — F419 Anxiety disorder, unspecified: Secondary | ICD-10-CM

## 2011-07-23 MED ORDER — LORAZEPAM 1 MG PO TABS: 1.0000 mg | ORAL_TABLET | ORAL | Status: DC | PRN

## 2011-07-23 NOTE — Telephone Encounter (Signed)
Please advise      KP 

## 2011-07-23 NOTE — Telephone Encounter (Signed)
Refill Lorazepam 1mg  tab #30 Take one tablet by mouth as needed Last fill 3.7.13 Last ov 3.7.13

## 2011-07-23 NOTE — Telephone Encounter (Signed)
Refill x1 

## 2011-07-23 NOTE — Telephone Encounter (Signed)
  Follow up Call-  Call back number 07/22/2011  Post procedure Call Back phone  # [phone]  Permission to leave phone message Yes     No answer at number given.  Message left on voice mail.

## 2011-07-23 NOTE — Telephone Encounter (Signed)
Faxed.   KP 

## 2011-07-27 ENCOUNTER — Encounter: Payer: Self-pay | Admitting: Internal Medicine

## 2011-08-04 ENCOUNTER — Other Ambulatory Visit: Payer: Self-pay | Admitting: Family Medicine

## 2011-08-04 DIAGNOSIS — G4700 Insomnia, unspecified: Secondary | ICD-10-CM

## 2011-08-04 MED ORDER — ZOLPIDEM TARTRATE 5 MG PO TABS: ORAL_TABLET | ORAL | Status: DC

## 2011-08-04 NOTE — Telephone Encounter (Signed)
Last seen 04/16/11 and filled 05/21/11 # 30. Please advise    KP

## 2011-08-04 NOTE — Addendum Note (Signed)
Addended by: Arnette Norris on: 08/04/2011 05:02 PM   Modules accepted: Orders

## 2011-08-04 NOTE — Telephone Encounter (Signed)
faxed          KP  

## 2011-08-04 NOTE — Telephone Encounter (Signed)
Refill x1 

## 2011-08-04 NOTE — Telephone Encounter (Signed)
refill zolpidem tartrate 5mg  #30, no refills left, last fill 4.11.13, last ov 3.7.13

## 2011-09-28 ENCOUNTER — Other Ambulatory Visit: Payer: Self-pay | Admitting: Family Medicine

## 2011-09-28 DIAGNOSIS — G4700 Insomnia, unspecified: Secondary | ICD-10-CM

## 2011-09-28 MED ORDER — ZOLPIDEM TARTRATE 5 MG PO TABS: ORAL_TABLET | ORAL | Status: DC

## 2011-09-28 NOTE — Telephone Encounter (Signed)
Refill refill zolpidem tartrate 5mg  #30, no refills left, last fill 6.25.13, last ov 3.7.13

## 2011-09-28 NOTE — Telephone Encounter (Signed)
Refill x1 

## 2011-09-28 NOTE — Telephone Encounter (Signed)
Please advise      KP 

## 2011-10-07 ENCOUNTER — Other Ambulatory Visit: Payer: Self-pay | Admitting: Family Medicine

## 2011-10-07 DIAGNOSIS — F419 Anxiety disorder, unspecified: Secondary | ICD-10-CM

## 2011-10-07 MED ORDER — LORAZEPAM 1 MG PO TABS: 1.0000 mg | ORAL_TABLET | ORAL | Status: DC | PRN

## 2011-10-07 NOTE — Telephone Encounter (Signed)
Refill LORazepam (Tab) ATIVAN 1 MG Take 1 tablet (1 mg total) by mouth as needed. #30, lasr fill 6.13.13 Last ov 3.7.13 V70

## 2011-10-22 ENCOUNTER — Other Ambulatory Visit: Payer: Self-pay | Admitting: *Deleted

## 2011-10-22 MED ORDER — LISINOPRIL 20 MG PO TABS: ORAL_TABLET | ORAL | Status: DC

## 2011-11-11 ENCOUNTER — Telehealth: Payer: Self-pay | Admitting: Family Medicine

## 2011-11-11 NOTE — Telephone Encounter (Signed)
Patient calling about her labs is it time and now that she is 54 what flu shot should she get.

## 2011-11-12 NOTE — Telephone Encounter (Signed)
Labs are due, please advise on which labs you would like for this patient to have and which flu vaccine you recommend.      KP

## 2011-11-12 NOTE — Telephone Encounter (Signed)
She actually should have a cpe-----  I thought we only had one flu shot?Kelly Elliott

## 2011-11-13 NOTE — Telephone Encounter (Signed)
Spoke with patient and scheduled her CPE for Jan.      KP

## 2011-11-13 NOTE — Telephone Encounter (Signed)
There are flu shots offered at the Pharmacy, which do you recommend for this patient.    KP

## 2011-11-13 NOTE — Telephone Encounter (Signed)
V70.0  272.4  Lipid, hep, bmp, cbcd, tsh. ua ----schedule cpe

## 2011-11-13 NOTE — Telephone Encounter (Signed)
i do not recommend the high dose flu shot----otherwise she can get it wherever is most convenient for her

## 2011-11-24 ENCOUNTER — Other Ambulatory Visit: Payer: Self-pay | Admitting: Family Medicine

## 2011-11-24 IMAGING — CR DG TIBIA/FIBULA 2V*R*
2 series · 2 of 2 positions shown · non-contrast
Comparison: The right ankle same date.

CLINICAL DATA: Fall.

RIGHT TIBIA AND FIBULA - 2 VIEW

[t tib/fib ap right]
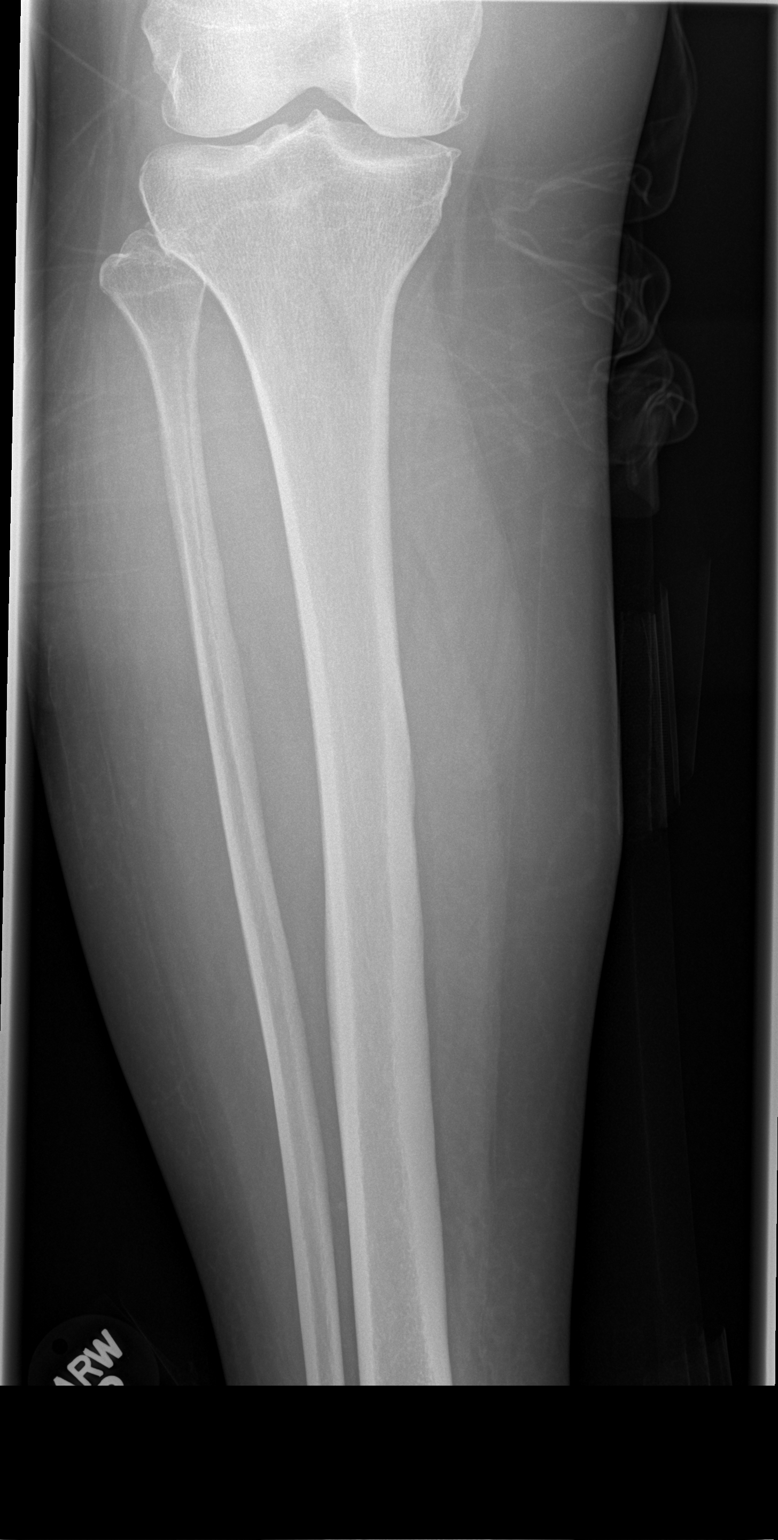

[view not recorded]
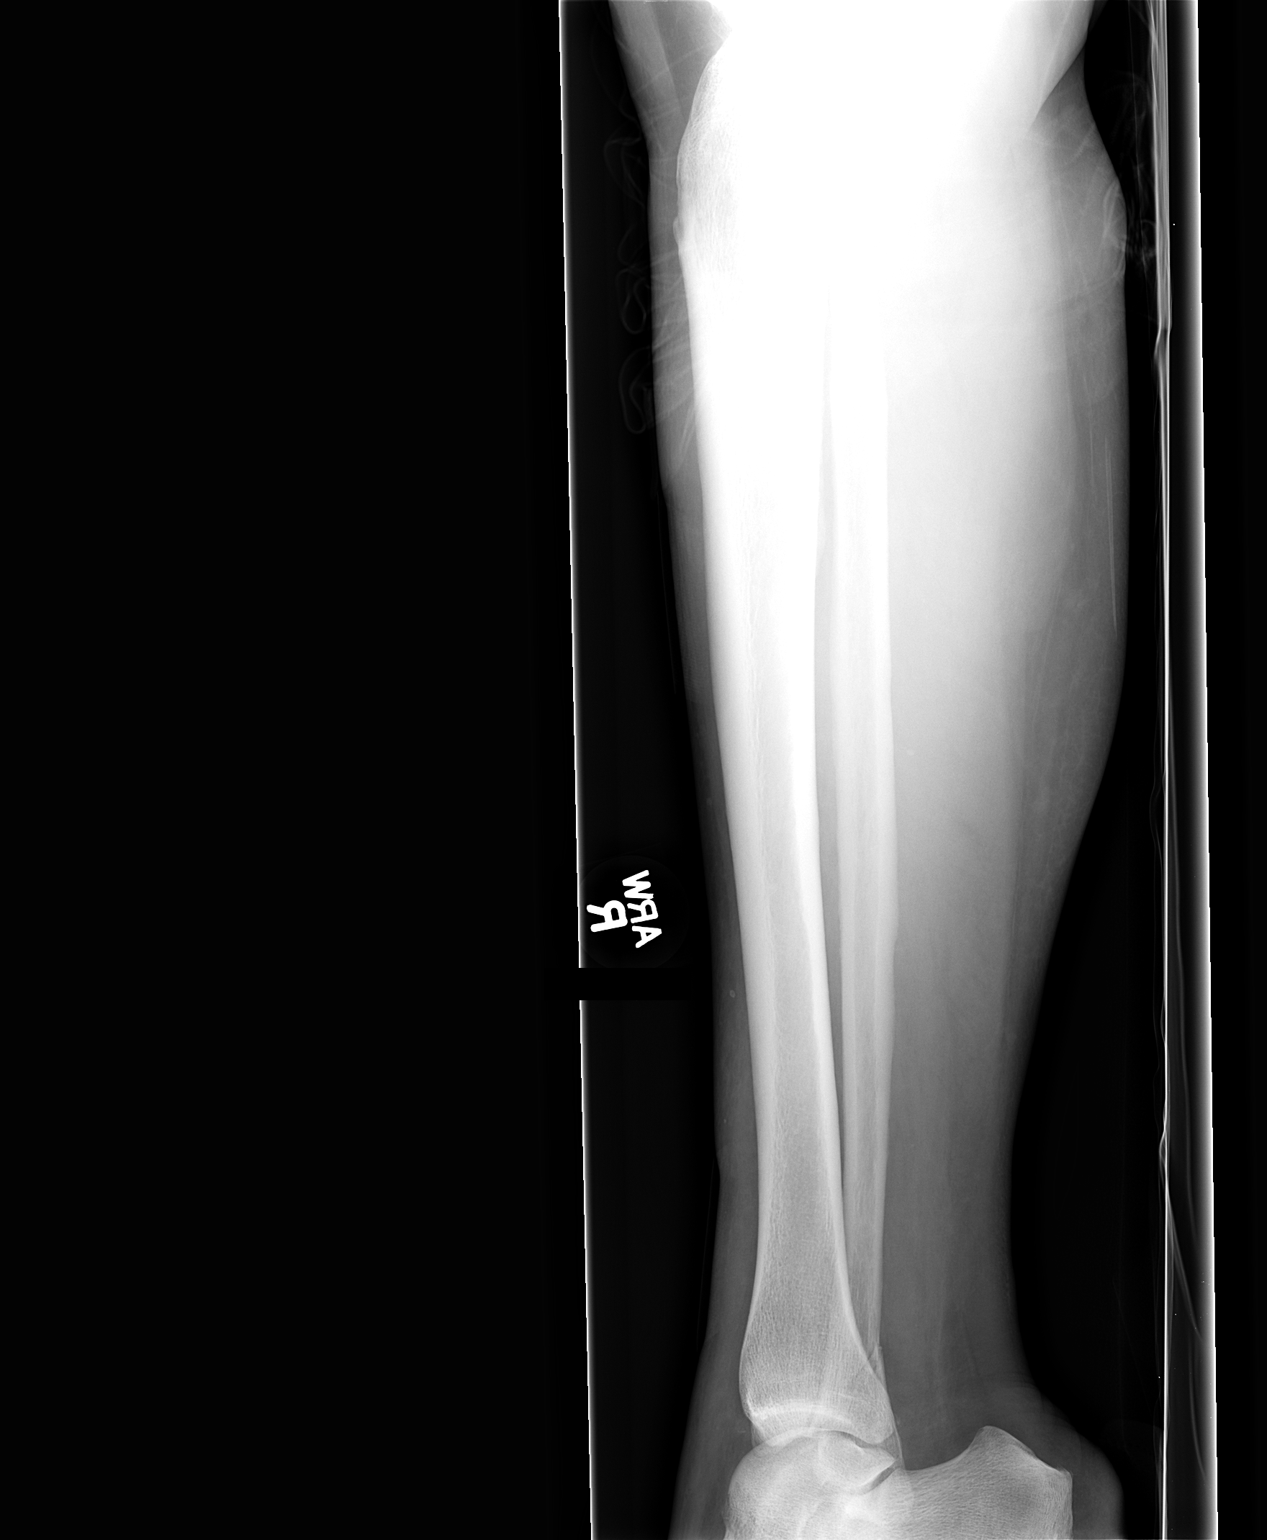

[2 of 2 positions shown; findings below may reference images not displayed]

FINDINGS: Oblique fracture of the distal right fibula as noted on
the ankle films. Interruption of the ankle mortise is better
appreciated on ankle films.  No proximal fracture noted.
IMPRESSION: Oblique fracture of the distal right fibula as noted on the ankle
films. Interruption of the ankle mortise is better appreciated on
ankle films.  No proximal fracture noted.

## 2011-11-24 IMAGING — CR DG ANKLE COMPLETE 3+V*R*
3 series · 3 of 3 positions shown · non-contrast
Comparison: None.

CLINICAL DATA: Fall.

RIGHT ANKLE - COMPLETE 3+ VIEW

[t ankle joint ap right]
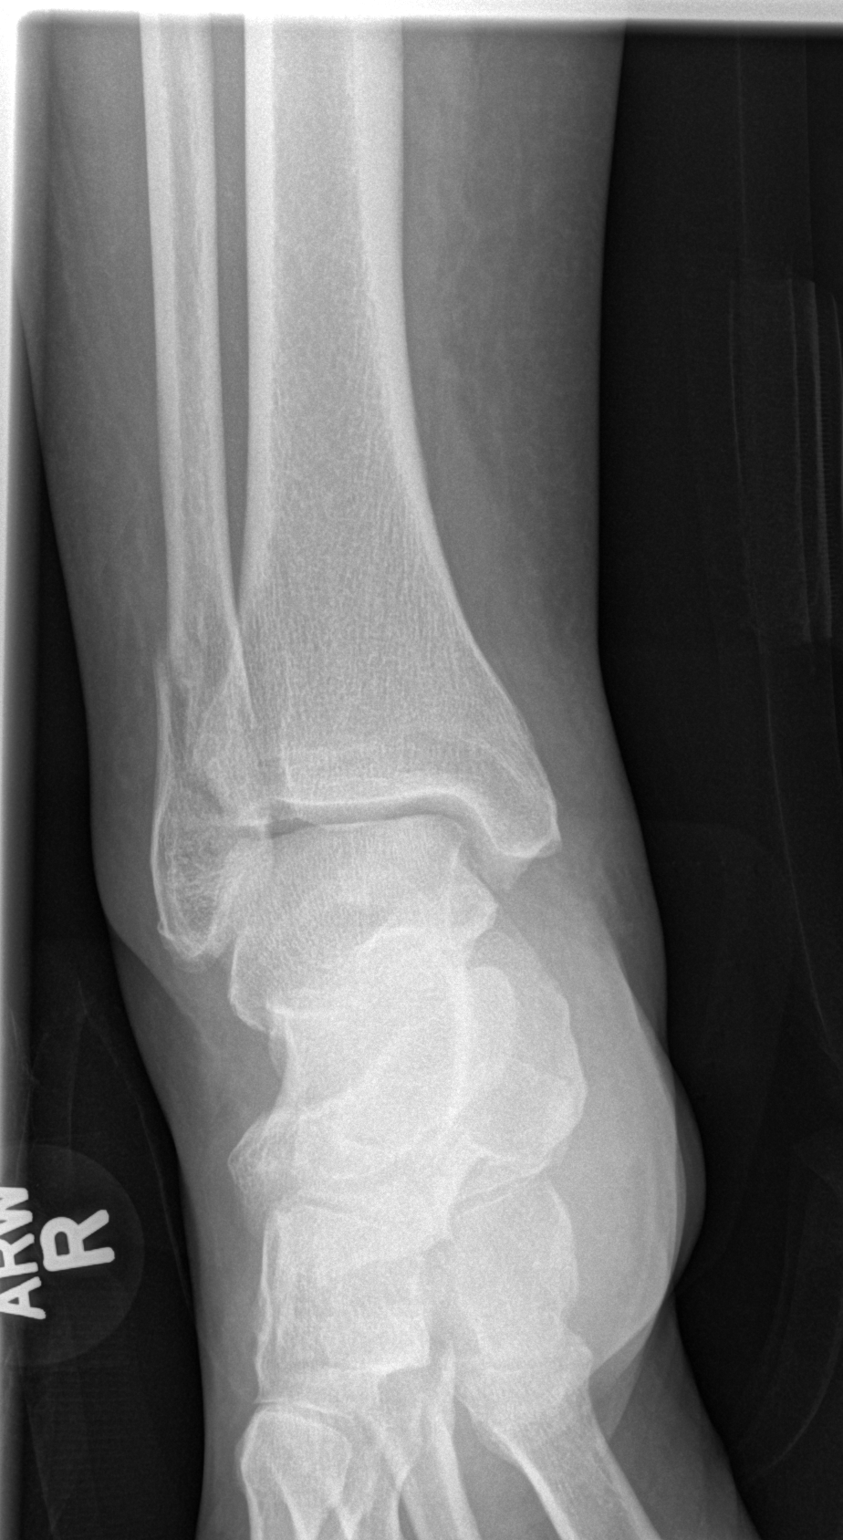

[t ankle joint oblique right]
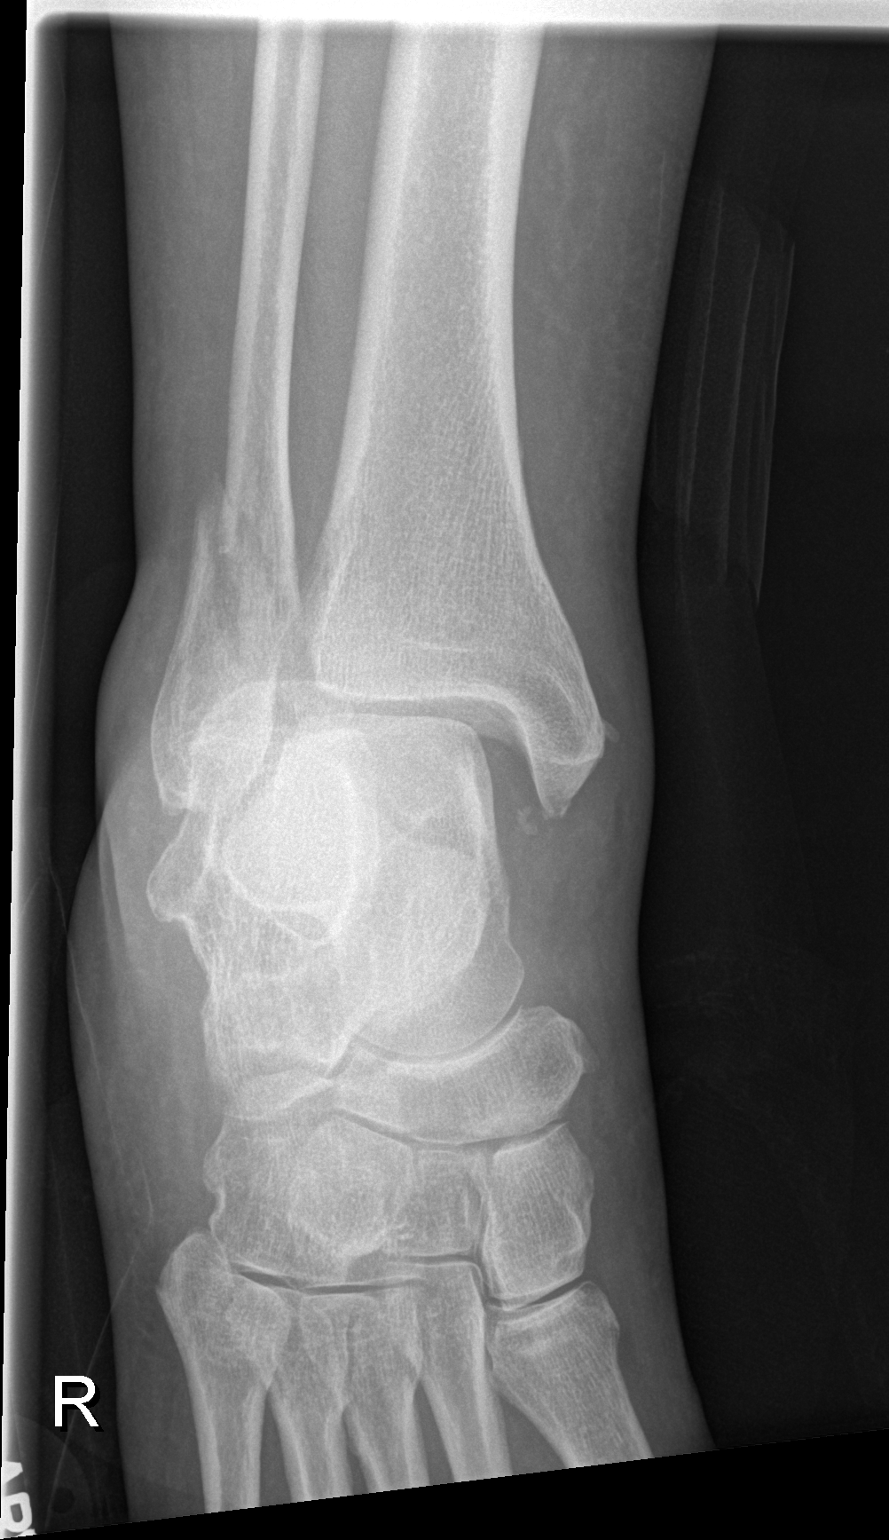

[view not recorded]
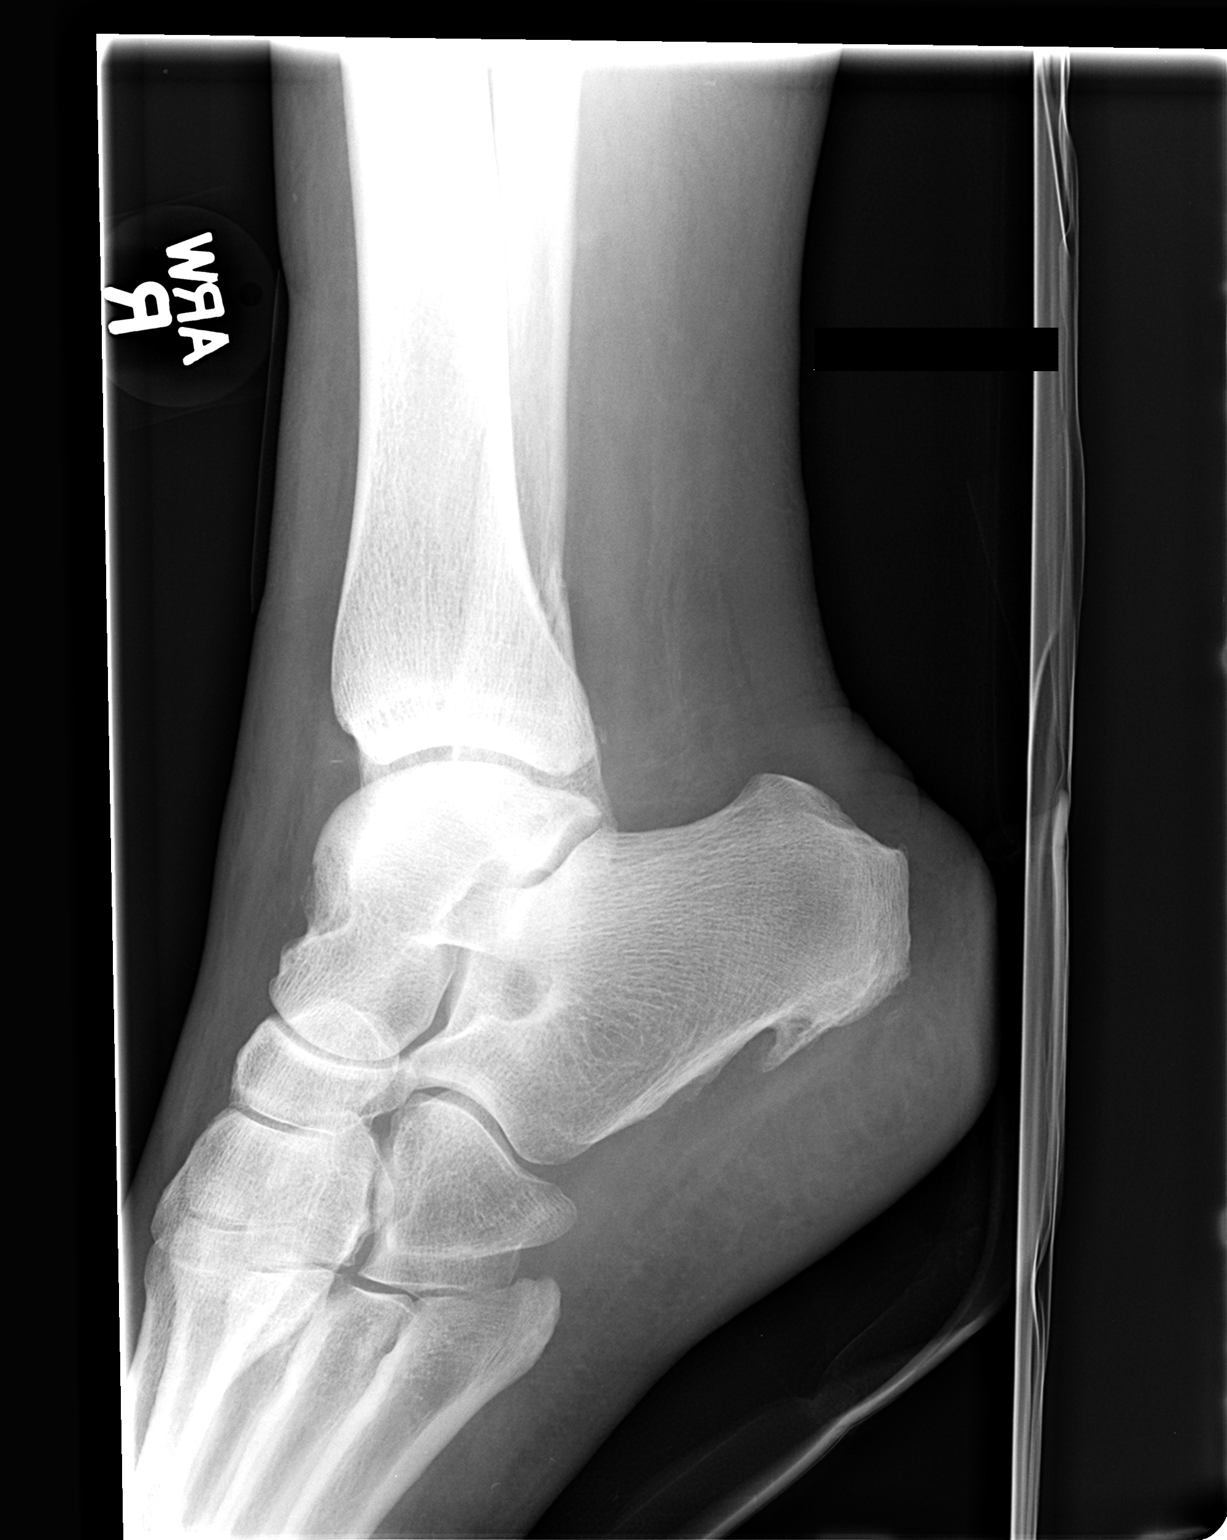

[3 of 3 positions shown; findings below may reference images not displayed]

FINDINGS: Oblique minimally displaced fracture of the distal right
fibula.

Interruption of the ankle mortise with the distal right tibia
displaced medially with widening of the medial tibial talar joint
space.  Small avulsion fracture of the medial malleolus may be
present.

Question bony coalition of the navicular and calcaneus?.  Plantar
spur.
IMPRESSION: Oblique minimally displaced fracture of the distal right fibula.

Interruption of the ankle mortise with the distal right tibia
displaced medially with widening of the medial tibial talar joint
space.  Small avulsion fracture of the medial malleolus may be
present.

## 2011-11-24 MED ORDER — ZOLPIDEM TARTRATE 5 MG PO TABS: ORAL_TABLET | ORAL | Status: DC

## 2011-11-24 NOTE — Telephone Encounter (Signed)
Dr.Hopper please advise, Dr. Laury Axon is out of the office. #30 given on 09/28/11, last OV 04/2011

## 2011-11-24 NOTE — Telephone Encounter (Signed)
RX called in .

## 2011-11-24 NOTE — Telephone Encounter (Signed)
ZOLPIDEN TARTRATE 5MG  QTY:30 LAST FILL: 09/28/11

## 2011-11-24 NOTE — Telephone Encounter (Signed)
OK X1 ; Rec 1 every 3rd prn only

## 2011-11-24 NOTE — Telephone Encounter (Signed)
I would like to verify ok #30 (?)

## 2011-11-24 NOTE — Telephone Encounter (Signed)
#   30 but recommend q 3rd night prn

## 2011-12-15 ENCOUNTER — Telehealth: Payer: Self-pay | Admitting: Family Medicine

## 2011-12-15 DIAGNOSIS — F419 Anxiety disorder, unspecified: Secondary | ICD-10-CM

## 2011-12-15 MED ORDER — LORAZEPAM 1 MG PO TABS: 1.0000 mg | ORAL_TABLET | ORAL | Status: DC | PRN

## 2011-12-15 NOTE — Telephone Encounter (Signed)
Last seen 04/16/11 and filled 10/07/11 # 30. Please advise    KP

## 2011-12-15 NOTE — Telephone Encounter (Signed)
Refill x1 

## 2011-12-15 NOTE — Telephone Encounter (Signed)
Refill: Loraepam 1 mg tab #30. Last fill 10-07-11

## 2011-12-15 NOTE — Telephone Encounter (Signed)
Called in rx to phillip @ pharmacy.

## 2011-12-29 ENCOUNTER — Telehealth: Payer: Self-pay | Admitting: Family Medicine

## 2011-12-29 MED ORDER — BUSPIRONE HCL 30 MG PO TABS: 30.0000 mg | ORAL_TABLET | Freq: Two times a day (BID) | ORAL | Status: DC

## 2011-12-29 NOTE — Telephone Encounter (Signed)
Refill: Buspirone hcl 30 mg tab # 60. Take one tablet by mouth two times daily. Last fill 11-24-11

## 2012-01-29 ENCOUNTER — Other Ambulatory Visit: Payer: Self-pay | Admitting: Internal Medicine

## 2012-01-29 DIAGNOSIS — E785 Hyperlipidemia, unspecified: Secondary | ICD-10-CM

## 2012-01-29 MED ORDER — ATORVASTATIN CALCIUM 20 MG PO TABS: ORAL_TABLET | ORAL | Status: DC

## 2012-01-29 NOTE — Telephone Encounter (Signed)
Last seen 04/16/11 and filled 11/24/11 #30. please advise    KP

## 2012-01-29 NOTE — Telephone Encounter (Signed)
Patient is aware to pickup and sign contract.      KP

## 2012-01-29 NOTE — Telephone Encounter (Signed)
Refill: Atorvastatin calcium 20 mg tab #30. Take one tablet by mouth every day. Last fill 12-14-11

## 2012-02-15 ENCOUNTER — Other Ambulatory Visit: Payer: Self-pay | Admitting: Family Medicine

## 2012-02-15 DIAGNOSIS — F419 Anxiety disorder, unspecified: Secondary | ICD-10-CM

## 2012-02-15 NOTE — Telephone Encounter (Signed)
Last seen 04/16/11 and filled 12/15/11 #30. Please advise     KP

## 2012-02-15 NOTE — Telephone Encounter (Signed)
REFILL LORazepam (Tab) 1 MG Take 1 tablet (1 mg total) by mouth as needed. #30 LAST FILL 11.5.13

## 2012-02-16 MED ORDER — LORAZEPAM 1 MG PO TABS: 1.0000 mg | ORAL_TABLET | ORAL | Status: DC | PRN

## 2012-02-16 NOTE — Telephone Encounter (Signed)
Refill x1   2 refills 

## 2012-02-17 ENCOUNTER — Encounter: Payer: Medicare Other | Admitting: Family Medicine

## 2012-03-07 ENCOUNTER — Telehealth: Payer: Self-pay | Admitting: Family Medicine

## 2012-03-07 MED ORDER — BUSPIRONE HCL 30 MG PO TABS: 30.0000 mg | ORAL_TABLET | Freq: Two times a day (BID) | ORAL | Status: DC

## 2012-03-07 NOTE — Telephone Encounter (Signed)
Refill: Busiprone hcl 30 mg tab #60. Take one tablet by mouth two times daily. Last fill 01-29-12

## 2012-03-26 ENCOUNTER — Other Ambulatory Visit: Payer: Self-pay

## 2012-03-28 ENCOUNTER — Encounter: Payer: Self-pay | Admitting: Family Medicine

## 2012-04-07 ENCOUNTER — Telehealth: Payer: Self-pay | Admitting: Family Medicine

## 2012-04-07 DIAGNOSIS — E785 Hyperlipidemia, unspecified: Secondary | ICD-10-CM

## 2012-04-07 MED ORDER — ATORVASTATIN CALCIUM 20 MG PO TABS: ORAL_TABLET | ORAL | Status: DC

## 2012-04-07 NOTE — Telephone Encounter (Signed)
refill Atorvastatin (Tab) 20 MG 1 tab by mouth daily-#30 last fill 12.20.13

## 2012-04-11 ENCOUNTER — Telehealth: Payer: Self-pay | Admitting: Family Medicine

## 2012-04-11 MED ORDER — BUSPIRONE HCL 30 MG PO TABS: 30.0000 mg | ORAL_TABLET | Freq: Two times a day (BID) | ORAL | Status: DC

## 2012-04-11 NOTE — Telephone Encounter (Signed)
refill  BusPIRone HCl (Tab) 30 MG Take 1 tablet (30 mg total) by mouth 2 (two) times daily.  #60 last fill 1.27.14

## 2012-04-11 NOTE — Telephone Encounter (Signed)
Refill: glipizide xl 2.5 mg #30. Take one tablet by mouth every day. Last fill 03-18-12

## 2012-04-11 NOTE — Telephone Encounter (Signed)
This is not on the medication listed and was added at the GI office. Please advise    KP

## 2012-04-12 MED ORDER — GLIPIZIDE ER 2.5 MG PO TB24: 2.5000 mg | ORAL_TABLET | Freq: Every day | ORAL | Status: DC

## 2012-04-12 MED ORDER — GLIPIZIDE 2.5 MG HALF TABLET: 2.5000 mg | ORAL_TABLET | Freq: Every day | ORAL | Status: DC

## 2012-04-12 NOTE — Telephone Encounter (Signed)
Note from the pharmacy advising the patient has been taking glipizide XL 2.5 mg 1 po qam. Please verify if XL or non extended release or if 2.5 mg daily or 1/2 of 2.5 = 1.25 mg. * note XL should not be halved or crushed. Please advise     KP

## 2012-04-12 NOTE — Addendum Note (Signed)
Addended by: Arnette Norris on: 04/12/2012 11:57 AM   Modules accepted: Orders, Medications

## 2012-04-12 NOTE — Telephone Encounter (Signed)
Rx sent with correct directions and dosing--   KP

## 2012-04-12 NOTE — Telephone Encounter (Signed)
Rx sent. The patient has an upcoming apt     KP

## 2012-04-12 NOTE — Telephone Encounter (Signed)
If she has been taking XL it should stay with xl

## 2012-04-12 NOTE — Telephone Encounter (Signed)
Ok to refill for 3 months but pt needs ov

## 2012-04-13 ENCOUNTER — Ambulatory Visit (INDEPENDENT_AMBULATORY_CARE_PROVIDER_SITE_OTHER): Payer: Medicare Other | Admitting: Family Medicine

## 2012-04-13 ENCOUNTER — Encounter: Payer: Self-pay | Admitting: Family Medicine

## 2012-04-13 VITALS — BP 218/104 | HR 78 | Temp 97.7°F | Ht 63.0 in | Wt 209.2 lb

## 2012-04-13 DIAGNOSIS — E1165 Type 2 diabetes mellitus with hyperglycemia: Secondary | ICD-10-CM

## 2012-04-13 DIAGNOSIS — IMO0002 Reserved for concepts with insufficient information to code with codable children: Secondary | ICD-10-CM

## 2012-04-13 DIAGNOSIS — I10 Essential (primary) hypertension: Secondary | ICD-10-CM

## 2012-04-13 DIAGNOSIS — Z0000 Encounter for general adult medical examination without abnormal findings: Secondary | ICD-10-CM

## 2012-04-13 DIAGNOSIS — E119 Type 2 diabetes mellitus without complications: Secondary | ICD-10-CM

## 2012-04-13 DIAGNOSIS — E7800 Pure hypercholesterolemia, unspecified: Secondary | ICD-10-CM

## 2012-04-13 DIAGNOSIS — E039 Hypothyroidism, unspecified: Secondary | ICD-10-CM

## 2012-04-13 DIAGNOSIS — E785 Hyperlipidemia, unspecified: Secondary | ICD-10-CM

## 2012-04-13 DIAGNOSIS — E118 Type 2 diabetes mellitus with unspecified complications: Secondary | ICD-10-CM

## 2012-04-13 DIAGNOSIS — Z23 Encounter for immunization: Secondary | ICD-10-CM

## 2012-04-13 LAB — CBC WITH DIFFERENTIAL/PLATELET
Basophils Relative: 0.6 % (ref 0.0–3.0)
Eosinophils Absolute: 0.3 10*3/uL (ref 0.0–0.7)
Eosinophils Relative: 2.5 % (ref 0.0–5.0)
Lymphocytes Relative: 22.8 % (ref 12.0–46.0)
MCHC: 34 g/dL (ref 30.0–36.0)
Neutrophils Relative %: 65.4 % (ref 43.0–77.0)
RBC: 4.07 Mil/uL (ref 3.87–5.11)
WBC: 11.5 10*3/uL — ABNORMAL HIGH (ref 4.5–10.5)

## 2012-04-13 LAB — LIPID PANEL
HDL: 82.1 mg/dL (ref >39.00)
Total CHOL/HDL Ratio: 3

## 2012-04-13 LAB — HEPATIC FUNCTION PANEL
Alkaline Phosphatase: 73 U/L (ref 39–117)
Bilirubin, Direct: 0 mg/dL (ref 0.0–0.3)
Total Bilirubin: 0.7 mg/dL (ref 0.3–1.2)
Total Protein: 8 g/dL (ref 6.0–8.3)

## 2012-04-13 LAB — BASIC METABOLIC PANEL
BUN: 14 mg/dL (ref 6–23)
Chloride: 93 mEq/L — ABNORMAL LOW (ref 96–112)
Creatinine, Ser: 0.7 mg/dL (ref 0.4–1.2)
Glucose, Bld: 80 mg/dL (ref 70–99)
Potassium: 3.9 mEq/L (ref 3.5–5.1)

## 2012-04-13 LAB — MICROALBUMIN / CREATININE URINE RATIO: Creatinine,U: 29.2 mg/dL

## 2012-04-13 LAB — TSH: TSH: 2.03 u[IU]/mL (ref 0.35–5.50)

## 2012-04-13 MED ORDER — AMLODIPINE BESYLATE 5 MG PO TABS: 5.0000 mg | ORAL_TABLET | Freq: Every day | ORAL | Status: DC

## 2012-04-13 MED ORDER — GLUCOSE BLOOD VI STRP: ORAL_STRIP | Status: AC

## 2012-04-13 MED ORDER — ONETOUCH DELICA LANCETS FINE MISC: 1.0000 | Freq: Every day | Status: AC

## 2012-04-13 MED ORDER — ATORVASTATIN CALCIUM 40 MG PO TABS: 40.0000 mg | ORAL_TABLET | Freq: Every day | ORAL | Status: DC

## 2012-04-13 NOTE — Patient Instructions (Addendum)
Preventive Care for Adults, Female A healthy lifestyle and preventive care can promote health and wellness. Preventive health guidelines for women include the following key practices.  A routine yearly physical is a good way to check with your caregiver about your health and preventive screening. It is a chance to share any concerns and updates on your health, and to receive a thorough exam.  Visit your dentist for a routine exam and preventive care every 6 months. Brush your teeth twice a day and floss once a day. Good oral hygiene prevents tooth decay and gum disease.  The frequency of eye exams is based on your age, health, family medical history, use of contact lenses, and other factors. Follow your caregiver's recommendations for frequency of eye exams.  Eat a healthy diet. Foods like vegetables, fruits, whole grains, low-fat dairy products, and lean protein foods contain the nutrients you need without too many calories. Decrease your intake of foods high in solid fats, added sugars, and salt. Eat the right amount of calories for you.Get information about a proper diet from your caregiver, if necessary.  Regular physical exercise is one of the most important things you can do for your health. Most adults should get at least 150 minutes of moderate-intensity exercise (any activity that increases your heart rate and causes you to sweat) each week. In addition, most adults need muscle-strengthening exercises on 2 or more days a week.  Maintain a healthy weight. The body mass index (BMI) is a screening tool to identify possible weight problems. It provides an estimate of body fat based on height and weight. Your caregiver can help determine your BMI, and can help you achieve or maintain a healthy weight.For adults 20 years and older:  A BMI below 18.5 is considered underweight.  A BMI of 18.5 to 24.9 is normal.  A BMI of 25 to 29.9 is considered overweight.  A BMI of 30 and above is  considered obese.  Maintain normal blood lipids and cholesterol levels by exercising and minimizing your intake of saturated fat. Eat a balanced diet with plenty of fruit and vegetables. Blood tests for lipids and cholesterol should begin at age 20 and be repeated every 5 years. If your lipid or cholesterol levels are high, you are over 50, or you are at high risk for heart disease, you may need your cholesterol levels checked more frequently.Ongoing high lipid and cholesterol levels should be treated with medicines if diet and exercise are not effective.  If you smoke, find out from your caregiver how to quit. If you do not use tobacco, do not start.  If you are pregnant, do not drink alcohol. If you are breastfeeding, be very cautious about drinking alcohol. If you are not pregnant and choose to drink alcohol, do not exceed 1 drink per day. One drink is considered to be 12 ounces (355 mL) of beer, 5 ounces (148 mL) of wine, or 1.5 ounces (44 mL) of liquor.  Avoid use of street drugs. Do not share needles with anyone. Ask for help if you need support or instructions about stopping the use of drugs.  High blood pressure causes heart disease and increases the risk of stroke. Your blood pressure should be checked at least every 1 to 2 years. Ongoing high blood pressure should be treated with medicines if weight loss and exercise are not effective.  If you are 55 to 70 years old, ask your caregiver if you should take aspirin to prevent strokes.  Diabetes   screening involves taking a blood sample to check your fasting blood sugar level. This should be done once every 3 years, after age 45, if you are within normal weight and without risk factors for diabetes. Testing should be considered at a younger age or be carried out more frequently if you are overweight and have at least 1 risk factor for diabetes.  Breast cancer screening is essential preventive care for women. You should practice "breast  self-awareness." This means understanding the normal appearance and feel of your breasts and may include breast self-examination. Any changes detected, no matter how small, should be reported to a caregiver. Women in their 20s and 30s should have a clinical breast exam (CBE) by a caregiver as part of a regular health exam every 1 to 3 years. After age 40, women should have a CBE every year. Starting at age 40, women should consider having a mammography (breast X-ray test) every year. Women who have a family history of breast cancer should talk to their caregiver about genetic screening. Women at a high risk of breast cancer should talk to their caregivers about having magnetic resonance imaging (MRI) and a mammography every year.  The Pap test is a screening test for cervical cancer. A Pap test can show cell changes on the cervix that might become cervical cancer if left untreated. A Pap test is a procedure in which cells are obtained and examined from the lower end of the uterus (cervix).  Women should have a Pap test starting at age 21.  Between ages 21 and 29, Pap tests should be repeated every 2 years.  Beginning at age 30, you should have a Pap test every 3 years as long as the past 3 Pap tests have been normal.  Some women have medical problems that increase the chance of getting cervical cancer. Talk to your caregiver about these problems. It is especially important to talk to your caregiver if a new problem develops soon after your last Pap test. In these cases, your caregiver may recommend more frequent screening and Pap tests.  The above recommendations are the same for women who have or have not gotten the vaccine for human papillomavirus (HPV).  If you had a hysterectomy for a problem that was not cancer or a condition that could lead to cancer, then you no longer need Pap tests. Even if you no longer need a Pap test, a regular exam is a good idea to make sure no other problems are  starting.  If you are between ages 65 and 70, and you have had normal Pap tests going back 10 years, you no longer need Pap tests. Even if you no longer need a Pap test, a regular exam is a good idea to make sure no other problems are starting.  If you have had past treatment for cervical cancer or a condition that could lead to cancer, you need Pap tests and screening for cancer for at least 20 years after your treatment.  If Pap tests have been discontinued, risk factors (such as a new sexual partner) need to be reassessed to determine if screening should be resumed.  The HPV test is an additional test that may be used for cervical cancer screening. The HPV test looks for the virus that can cause the cell changes on the cervix. The cells collected during the Pap test can be tested for HPV. The HPV test could be used to screen women aged 30 years and older, and should   be used in women of any age who have unclear Pap test results. After the age of 30, women should have HPV testing at the same frequency as a Pap test.  Colorectal cancer can be detected and often prevented. Most routine colorectal cancer screening begins at the age of 50 and continues through age 75. However, your caregiver may recommend screening at an earlier age if you have risk factors for colon cancer. On a yearly basis, your caregiver may provide home test kits to check for hidden blood in the stool. Use of a small camera at the end of a tube, to directly examine the colon (sigmoidoscopy or colonoscopy), can detect the earliest forms of colorectal cancer. Talk to your caregiver about this at age 50, when routine screening begins. Direct examination of the colon should be repeated every 5 to 10 years through age 75, unless early forms of pre-cancerous polyps or small growths are found.  Hepatitis C blood testing is recommended for all people born from 1945 through 1965 and any individual with known risks for hepatitis C.  Practice  safe sex. Use condoms and avoid high-risk sexual practices to reduce the spread of sexually transmitted infections (STIs). STIs include gonorrhea, chlamydia, syphilis, trichomonas, herpes, HPV, and human immunodeficiency virus (HIV). Herpes, HIV, and HPV are viral illnesses that have no cure. They can result in disability, cancer, and death. Sexually active women aged 25 and younger should be checked for chlamydia. Older women with new or multiple partners should also be tested for chlamydia. Testing for other STIs is recommended if you are sexually active and at increased risk.  Osteoporosis is a disease in which the bones lose minerals and strength with aging. This can result in serious bone fractures. The risk of osteoporosis can be identified using a bone density scan. Women ages 65 and over and women at risk for fractures or osteoporosis should discuss screening with their caregivers. Ask your caregiver whether you should take a calcium supplement or vitamin D to reduce the rate of osteoporosis.  Menopause can be associated with physical symptoms and risks. Hormone replacement therapy is available to decrease symptoms and risks. You should talk to your caregiver about whether hormone replacement therapy is right for you.  Use sunscreen with sun protection factor (SPF) of 30 or more. Apply sunscreen liberally and repeatedly throughout the day. You should seek shade when your shadow is shorter than you. Protect yourself by wearing long sleeves, pants, a wide-brimmed hat, and sunglasses year round, whenever you are outdoors.  Once a month, do a whole body skin exam, using a mirror to look at the skin on your back. Notify your caregiver of new moles, moles that have irregular borders, moles that are larger than a pencil eraser, or moles that have changed in shape or color.  Stay current with required immunizations.  Influenza. You need a dose every fall (or winter). The composition of the flu vaccine  changes each year, so being vaccinated once is not enough.  Pneumococcal polysaccharide. You need 1 to 2 doses if you smoke cigarettes or if you have certain chronic medical conditions. You need 1 dose at age 65 (or older) if you have never been vaccinated.  Tetanus, diphtheria, pertussis (Tdap, Td). Get 1 dose of Tdap vaccine if you are younger than age 65, are over 65 and have contact with an infant, are a healthcare worker, are pregnant, or simply want to be protected from whooping cough. After that, you need a Td   booster dose every 10 years. Consult your caregiver if you have not had at least 3 tetanus and diphtheria-containing shots sometime in your life or have a deep or dirty wound.  HPV. You need this vaccine if you are a woman age 26 or younger. The vaccine is given in 3 doses over 6 months.  Measles, mumps, rubella (MMR). You need at least 1 dose of MMR if you were born in 1957 or later. You may also need a second dose.  Meningococcal. If you are age 19 to 21 and a first-year college student living in a residence hall, or have one of several medical conditions, you need to get vaccinated against meningococcal disease. You may also need additional booster doses.  Zoster (shingles). If you are age 60 or older, you should get this vaccine.  Varicella (chickenpox). If you have never had chickenpox or you were vaccinated but received only 1 dose, talk to your caregiver to find out if you need this vaccine.  Hepatitis A. You need this vaccine if you have a specific risk factor for hepatitis A virus infection or you simply wish to be protected from this disease. The vaccine is usually given as 2 doses, 6 to 18 months apart.  Hepatitis B. You need this vaccine if you have a specific risk factor for hepatitis B virus infection or you simply wish to be protected from this disease. The vaccine is given in 3 doses, usually over 6 months. Preventive Services / Frequency Ages 19 to 39  Blood  pressure check.** / Every 1 to 2 years.  Lipid and cholesterol check.** / Every 5 years beginning at age 20.  Clinical breast exam.** / Every 3 years for women in their 20s and 30s.  Pap test.** / Every 2 years from ages 21 through 29. Every 3 years starting at age 30 through age 65 or 70 with a history of 3 consecutive normal Pap tests.  HPV screening.** / Every 3 years from ages 30 through ages 65 to 70 with a history of 3 consecutive normal Pap tests.  Hepatitis C blood test.** / For any individual with known risks for hepatitis C.  Skin self-exam. / Monthly.  Influenza immunization.** / Every year.  Pneumococcal polysaccharide immunization.** / 1 to 2 doses if you smoke cigarettes or if you have certain chronic medical conditions.  Tetanus, diphtheria, pertussis (Tdap, Td) immunization. / A one-time dose of Tdap vaccine. After that, you need a Td booster dose every 10 years.  HPV immunization. / 3 doses over 6 months, if you are 26 and younger.  Measles, mumps, rubella (MMR) immunization. / You need at least 1 dose of MMR if you were born in 1957 or later. You may also need a second dose.  Meningococcal immunization. / 1 dose if you are age 19 to 21 and a first-year college student living in a residence hall, or have one of several medical conditions, you need to get vaccinated against meningococcal disease. You may also need additional booster doses.  Varicella immunization.** / Consult your caregiver.  Hepatitis A immunization.** / Consult your caregiver. 2 doses, 6 to 18 months apart.  Hepatitis B immunization.** / Consult your caregiver. 3 doses usually over 6 months. Ages 40 to 64  Blood pressure check.** / Every 1 to 2 years.  Lipid and cholesterol check.** / Every 5 years beginning at age 20.  Clinical breast exam.** / Every year after age 40.  Mammogram.** / Every year beginning at age 40   and continuing for as long as you are in good health. Consult with your  caregiver.  Pap test.** / Every 3 years starting at age 30 through age 65 or 70 with a history of 3 consecutive normal Pap tests.  HPV screening.** / Every 3 years from ages 30 through ages 65 to 70 with a history of 3 consecutive normal Pap tests.  Fecal occult blood test (FOBT) of stool. / Every year beginning at age 50 and continuing until age 75. You may not need to do this test if you get a colonoscopy every 10 years.  Flexible sigmoidoscopy or colonoscopy.** / Every 5 years for a flexible sigmoidoscopy or every 10 years for a colonoscopy beginning at age 50 and continuing until age 75.  Hepatitis C blood test.** / For all people born from 1945 through 1965 and any individual with known risks for hepatitis C.  Skin self-exam. / Monthly.  Influenza immunization.** / Every year.  Pneumococcal polysaccharide immunization.** / 1 to 2 doses if you smoke cigarettes or if you have certain chronic medical conditions.  Tetanus, diphtheria, pertussis (Tdap, Td) immunization.** / A one-time dose of Tdap vaccine. After that, you need a Td booster dose every 10 years.  Measles, mumps, rubella (MMR) immunization. / You need at least 1 dose of MMR if you were born in 1957 or later. You may also need a second dose.  Varicella immunization.** / Consult your caregiver.  Meningococcal immunization.** / Consult your caregiver.  Hepatitis A immunization.** / Consult your caregiver. 2 doses, 6 to 18 months apart.  Hepatitis B immunization.** / Consult your caregiver. 3 doses, usually over 6 months. Ages 65 and over  Blood pressure check.** / Every 1 to 2 years.  Lipid and cholesterol check.** / Every 5 years beginning at age 20.  Clinical breast exam.** / Every year after age 40.  Mammogram.** / Every year beginning at age 40 and continuing for as long as you are in good health. Consult with your caregiver.  Pap test.** / Every 3 years starting at age 30 through age 65 or 70 with a 3  consecutive normal Pap tests. Testing can be stopped between 65 and 70 with 3 consecutive normal Pap tests and no abnormal Pap or HPV tests in the past 10 years.  HPV screening.** / Every 3 years from ages 30 through ages 65 or 70 with a history of 3 consecutive normal Pap tests. Testing can be stopped between 65 and 70 with 3 consecutive normal Pap tests and no abnormal Pap or HPV tests in the past 10 years.  Fecal occult blood test (FOBT) of stool. / Every year beginning at age 50 and continuing until age 75. You may not need to do this test if you get a colonoscopy every 10 years.  Flexible sigmoidoscopy or colonoscopy.** / Every 5 years for a flexible sigmoidoscopy or every 10 years for a colonoscopy beginning at age 50 and continuing until age 75.  Hepatitis C blood test.** / For all people born from 1945 through 1965 and any individual with known risks for hepatitis C.  Osteoporosis screening.** / A one-time screening for women ages 65 and over and women at risk for fractures or osteoporosis.  Skin self-exam. / Monthly.  Influenza immunization.** / Every year.  Pneumococcal polysaccharide immunization.** / 1 dose at age 65 (or older) if you have never been vaccinated.  Tetanus, diphtheria, pertussis (Tdap, Td) immunization. / A one-time dose of Tdap vaccine if you are over   65 and have contact with an infant, are a healthcare worker, or simply want to be protected from whooping cough. After that, you need a Td booster dose every 10 years.  Varicella immunization.** / Consult your caregiver.  Meningococcal immunization.** / Consult your caregiver.  Hepatitis A immunization.** / Consult your caregiver. 2 doses, 6 to 18 months apart.  Hepatitis B immunization.** / Check with your caregiver. 3 doses, usually over 6 months. ** Family history and personal history of risk and conditions may change your caregiver's recommendations. Document Released: 03/24/2001 Document Revised: 04/20/2011  Document Reviewed: 06/23/2010 [employer] Patient Information 2013 [employer]  

## 2012-04-13 NOTE — Assessment & Plan Note (Signed)
Check labs 

## 2012-04-13 NOTE — Assessment & Plan Note (Signed)
Elevated today Add norvasc 5 mg rto 2-3 weeks

## 2012-04-13 NOTE — Progress Notes (Signed)
Subjective:    Kelly Elliott is a 72 y.o. female who presents for Medicare Annual/Subsequent preventive examination.  Preventive Screening-Counseling & Management  Tobacco History  Smoking status  . Never Smoker   Smokeless tobacco  . Never Used     Problems Prior to Visit 1.   Current Problems (verified) Patient Active Problem List  Diagnosis  . Abnormal ECG  . Preoperative cardiovascular examination  . Essential hypertension, benign  . HTN (hypertension)  . Elevated cholesterol  . Edema  . Diabetes mellitus    Medications Prior to Visit Current Outpatient Prescriptions on File Prior to Visit  Medication Sig Dispense Refill  . aspirin 81 MG tablet Take 81 mg by mouth daily.        Marland Kitchen atorvastatin (LIPITOR) 20 MG tablet 1 tab by mouth daily--repeat labs are due now  30 tablet  0  . busPIRone (BUSPAR) 30 MG tablet Take 1 tablet (30 mg total) by mouth 2 (two) times daily.  60 tablet  0  . Calcium Carbonate (CALTRATE 600 PO) Take 1 tablet by mouth daily.        . cholecalciferol (VITAMIN D) 1000 UNITS tablet Take 1,000 Units by mouth daily.        . Coenzyme Q10 (COQ-10) 400 MG CAPS Take 1 capsule by mouth daily.      Marland Kitchen glipiZIDE (GLUCOTROL XL) 2.5 MG 24 hr tablet Take 1 tablet (2.5 mg total) by mouth daily.  30 tablet  2  . levothyroxine (SYNTHROID, LEVOTHROID) 88 MCG tablet Take 1 tablet (88 mcg total) by mouth daily.  30 tablet  5  . lisinopril (PRINIVIL,ZESTRIL) 20 MG tablet TWO TABLETS EVERY AM AND ONE TABLET EVERY PM  90 tablet  12  . LORazepam (ATIVAN) 1 MG tablet Take 1 tablet (1 mg total) by mouth as needed.  30 tablet  2  . Multiple Vitamins-Minerals (CENTRUM SILVER ULTRA WOMENS) TABS Take 1 tablet by mouth daily.       Current Facility-Administered Medications on File Prior to Visit  Medication Dose Route Frequency Provider Last Rate Last Dose  . 0.9 %  sodium chloride infusion  500 mL Intravenous Continuous Hart Carwin, MD        Current Medications  (verified) Current Outpatient Prescriptions  Medication Sig Dispense Refill  . aspirin 81 MG tablet Take 81 mg by mouth daily.        Marland Kitchen atorvastatin (LIPITOR) 20 MG tablet 1 tab by mouth daily--repeat labs are due now  30 tablet  0  . busPIRone (BUSPAR) 30 MG tablet Take 1 tablet (30 mg total) by mouth 2 (two) times daily.  60 tablet  0  . Calcium Carbonate (CALTRATE 600 PO) Take 1 tablet by mouth daily.        . cholecalciferol (VITAMIN D) 1000 UNITS tablet Take 1,000 Units by mouth daily.        . Coenzyme Q10 (COQ-10) 400 MG CAPS Take 1 capsule by mouth daily.      Marland Kitchen glipiZIDE (GLUCOTROL XL) 2.5 MG 24 hr tablet Take 1 tablet (2.5 mg total) by mouth daily.  30 tablet  2  . levothyroxine (SYNTHROID, LEVOTHROID) 88 MCG tablet Take 1 tablet (88 mcg total) by mouth daily.  30 tablet  5  . lisinopril (PRINIVIL,ZESTRIL) 20 MG tablet TWO TABLETS EVERY AM AND ONE TABLET EVERY PM  90 tablet  12  . LORazepam (ATIVAN) 1 MG tablet Take 1 tablet (1 mg total) by mouth as needed.  30 tablet  2  . Multiple Vitamins-Minerals (CENTRUM SILVER ULTRA WOMENS) TABS Take 1 tablet by mouth daily.      Marland Kitchen zolpidem (AMBIEN) 5 MG tablet       . amLODipine (NORVASC) 5 MG tablet Take 1 tablet (5 mg total) by mouth daily.  30 tablet  3   Current Facility-Administered Medications  Medication Dose Route Frequency Provider Last Rate Last Dose  . 0.9 %  sodium chloride infusion  500 mL Intravenous Continuous Hart Carwin, MD         Allergies (verified) Fluorescein and Menthol   PAST HISTORY  Family History Family History  Problem Relation Age of Onset  . Diabetes Mother   . Hyperlipidemia Father   . Hypertension Father   . Stroke Father 19    quadruple bypass  . Heart disease Father     quadruple bypass  . Diabetes Brother   . Hypertension Brother   . Hyperlipidemia Brother   . Colon cancer Neg Hx   . Rectal cancer Neg Hx   . Stomach cancer Neg Hx   . Esophageal cancer Neg Hx     Social  History History  Substance Use Topics  . Smoking status: Never Smoker   . Smokeless tobacco: Never Used  . Alcohol Use: 4.2 oz/week    7 Glasses of wine per week     Are there smokers in your home (other than you)? No  Risk Factors Current exercise habits: The patient does not participate in regular exercise at present.  Dietary issues discussed: na  Cardiac risk factors: advanced age (older than 69 for men, 76 for women), diabetes mellitus, dyslipidemia, hypertension, obesity (BMI >= 30 kg/m2) and sedentary lifestyle.  Depression Screen (Note: if answer to either of the following is "Yes", a more complete depression screening is indicated)   Over the past two weeks, have you felt down, depressed or hopeless? Yes  Over the past two weeks, have you felt little interest or pleasure in doing things? Yes  Have you lost interest or pleasure in daily life? Yes  Do you often feel hopeless? Yes  Do you cry easily over simple problems? No  Activities of Daily Living In your present state of health, do you have any difficulty performing the following activities?:  Driving? No Managing money?  No Feeding yourself? No Getting from bed to chair? No Climbing a flight of stairs? No Preparing food and eating?: No Bathing or showering? No Getting dressed: No Getting to the toilet? No Using the toilet:No Moving around from place to place: No In the past year have you fallen or had a near fall?:No   Are you sexually active?  No  Do you have more than one partner?  No  Hearing Difficulties: No Do you often ask people to speak up or repeat themselves? No Do you experience ringing or noises in your ears? No Do you have difficulty understanding soft or whispered voices? No   Do you feel that you have a problem with memory? No  Do you often misplace items? No  Do you feel safe at home?  Yes  Cognitive Testing  Alert? Yes  Normal Appearance?Yes  Oriented to person? Yes  Place? Yes    Time? Yes  Recall of three objects?  Yes  Can perform simple calculations? Yes  Displays appropriate judgment?Yes  Can read the correct time from a watch face?Yes   Advanced Directives have been discussed with the patient? Yes  List  the Names of Other Physician/Practitioners you currently use: 1.  Derm--gruber 2.  opht--sanders,  Kozlowski 3.  Dentist-- Renard Hamper 4.  Ortho--Geofry 5,  Cardi--Krishan  Indicate any recent Medical Services you may have received from other than [facility] providers in the past year (date may be approximate).  Immunization History  Administered Date(s) Administered  . Influenza Split 11/17/2010, 10/11/2011    Screening Tests Health Maintenance  Topic Date Due  . Foot Exam  10/04/1952  . Tetanus/tdap  10/04/1961  . Hemoglobin A1c  10/17/2011  . Urine Microalbumin  04/15/2012  . Ophthalmology Exam  09/01/2012  . Influenza Vaccine  10/10/2012  . Mammogram  07/10/2013  . Colonoscopy  07/21/2016  . Pneumococcal Polysaccharide Vaccine Age 31 And Over  Completed  . Zostavax  Addressed    All answers were reviewed with the patient and necessary referrals were made:  Loreen Freud, DO   04/13/2012   History reviewed:  She  has a past medical history of Anxiety; Diabetes mellitus; Hypercholesterolemia; Hypertension; Hypothyroidism; Lumbar pain; and Herniated disc. She  does not have any pertinent problems on file. She  has past surgical history that includes Leg Surgery (05/2010) and Cataract extraction, bilateral. Her family history includes Diabetes in her brother and mother; Heart disease in her father; Hyperlipidemia in her brother and father; Hypertension in her brother and father; and Stroke (age of onset: 63) in her father.  There is no history of Colon cancer, and Rectal cancer, and Stomach cancer, and Esophageal cancer, . She  reports that she has never smoked. She has never used smokeless tobacco. She reports that she drinks about 4.2 ounces of alcohol per  week. She reports that she does not use illicit drugs. She has a current medication list which includes the following prescription(s): aspirin, atorvastatin, buspirone, calcium carbonate, cholecalciferol, coq-10, glipizide, levothyroxine, lisinopril, lorazepam, centrum silver ultra womens, zolpidem, and amlodipine, and the following Facility-Administered Medications: sodium chloride. Current Outpatient Prescriptions on File Prior to Visit  Medication Sig Dispense Refill  . aspirin 81 MG tablet Take 81 mg by mouth daily.        Marland Kitchen atorvastatin (LIPITOR) 20 MG tablet 1 tab by mouth daily--repeat labs are due now  30 tablet  0  . busPIRone (BUSPAR) 30 MG tablet Take 1 tablet (30 mg total) by mouth 2 (two) times daily.  60 tablet  0  . Calcium Carbonate (CALTRATE 600 PO) Take 1 tablet by mouth daily.        . cholecalciferol (VITAMIN D) 1000 UNITS tablet Take 1,000 Units by mouth daily.        . Coenzyme Q10 (COQ-10) 400 MG CAPS Take 1 capsule by mouth daily.      Marland Kitchen glipiZIDE (GLUCOTROL XL) 2.5 MG 24 hr tablet Take 1 tablet (2.5 mg total) by mouth daily.  30 tablet  2  . levothyroxine (SYNTHROID, LEVOTHROID) 88 MCG tablet Take 1 tablet (88 mcg total) by mouth daily.  30 tablet  5  . lisinopril (PRINIVIL,ZESTRIL) 20 MG tablet TWO TABLETS EVERY AM AND ONE TABLET EVERY PM  90 tablet  12  . LORazepam (ATIVAN) 1 MG tablet Take 1 tablet (1 mg total) by mouth as needed.  30 tablet  2  . Multiple Vitamins-Minerals (CENTRUM SILVER ULTRA WOMENS) TABS Take 1 tablet by mouth daily.       Current Facility-Administered Medications on File Prior to Visit  Medication Dose Route Frequency Provider Last Rate Last Dose  . 0.9 %  sodium chloride infusion  500 mL Intravenous Continuous Hart Carwin, MD       She is allergic to fluorescein and menthol.  Review of Systems  Review of Systems  Constitutional: Negative for activity change, appetite change and fatigue.  HENT: Negative for hearing loss, congestion,  tinnitus and ear discharge.   Eyes: Negative for visual disturbance (see optho q1y -- vision corrected to 20/20 with glasses).  Respiratory: Negative for cough, chest tightness and shortness of breath.   Cardiovascular: Negative for chest pain, palpitations and leg swelling.  Gastrointestinal: Negative for abdominal pain, diarrhea, constipation and abdominal distention.  Genitourinary: Negative for urgency, frequency, decreased urine volume and difficulty urinating.  Musculoskeletal: Negative for back pain, arthralgias and gait problem.  Skin: Negative for color change, pallor and rash.  Neurological: Negative for dizziness, light-headedness, numbness and headaches.  Hematological: Negative for adenopathy. Does not bruise/bleed easily.  Psychiatric/Behavioral: Negative for suicidal ideas, confusion, sleep disturbance, self-injury, dysphoric mood, decreased concentration and agitation.  Pt is able to read and write and can do all ADLs No risk for falling No abuse/ violence in home      Objective:     Vision by Snellen chart: opth   Body mass index is 37.07 kg/(m^2). BP 218/104  Pulse 78  Temp(Src) 97.7 F (36.5 C) (Oral)  Ht 5\' 3"  (1.6 m)  Wt 209 lb 3.2 oz (94.892 kg)  BMI 37.07 kg/m2  SpO2 78%  BP 218/104  Pulse 78  Temp(Src) 97.7 F (36.5 C) (Oral)  Ht 5\' 3"  (1.6 m)  Wt 209 lb 3.2 oz (94.892 kg)  BMI 37.07 kg/m2  SpO2 78% General appearance: alert, cooperative, appears stated age and no distress Head: Normocephalic, without obvious abnormality, atraumatic Eyes: conjunctivae/corneas clear. PERRL, EOM's intact. Fundi benign. Ears: normal TM's and external ear canals both ears Nose: Nares normal. Septum midline. Mucosa normal. No drainage or sinus tenderness. Throat: lips, mucosa, and tongue normal; teeth and gums normal Neck: no adenopathy, no carotid bruit, no JVD, supple, symmetrical, trachea midline and thyroid not enlarged, symmetric, no tenderness/mass/nodules Back:  symmetric, no curvature. ROM normal. No CVA tenderness. Lungs: clear to auscultation bilaterally Breasts: normal appearance, no masses or tenderness Heart: regular rate and rhythm, S1, S2 normal, no murmur, click, rub or gallop Abdomen: soft, non-tender; bowel sounds normal; no masses,  no organomegaly Pelvic: deferred--postmenopausal Extremities: extremities normal, atraumatic, no cyanosis or edema Pulses: 2+ and symmetric Skin: Skin color, texture, turgor normal. No rashes or lesions Lymph nodes: Cervical, supraclavicular, and axillary nodes normal. Neurologic: Alert and oriented X 3, normal strength and tone. Normal symmetric reflexes. Normal coordination and gait Psych-- no anxiety, no depression      Assessment:    cpe     Plan:     During the course of the visit the patient was educated and counseled about appropriate screening and preventive services including:    Pneumococcal vaccine   Influenza vaccine  Td vaccine  Screening electrocardiogram  Screening mammography  Bone densitometry screening  Colorectal cancer screening  Diabetes screening  Glaucoma screening  Nutrition counseling   Advanced directives: has an advanced directive - a copy HAS NOT been provided.  Diet review for nutrition referral? Yes ____  Not Indicated ___x_   Patient Instructions (the written plan) was given to the patient.  Medicare Attestation I have personally reviewed: The patient's medical and social history Their use of alcohol, tobacco or illicit drugs Their current medications and supplements The patient's functional ability including ADLs,fall risks, home safety  risks, cognitive, and hearing and visual impairment Diet and physical activities Evidence for depression or mood disorders  The patient's weight, height, BMI, and visual acuity have been recorded in the chart.  I have made referrals, counseling, and provided education to the patient based on review of the  above and I have provided the patient with a written personalized care plan for preventive services.     Loreen Freud, DO   04/13/2012

## 2012-04-13 NOTE — Assessment & Plan Note (Signed)
con't meds  Check labs 

## 2012-04-14 LAB — POCT URINALYSIS DIPSTICK
Ketones, UA: NEGATIVE
Leukocytes, UA: NEGATIVE
Nitrite, UA: NEGATIVE
Protein, UA: NEGATIVE
Urobilinogen, UA: 0.2

## 2012-05-02 ENCOUNTER — Telehealth: Payer: Self-pay | Admitting: Family Medicine

## 2012-05-02 DIAGNOSIS — E118 Type 2 diabetes mellitus with unspecified complications: Secondary | ICD-10-CM

## 2012-05-02 DIAGNOSIS — Z0000 Encounter for general adult medical examination without abnormal findings: Secondary | ICD-10-CM

## 2012-05-02 DIAGNOSIS — E039 Hypothyroidism, unspecified: Secondary | ICD-10-CM

## 2012-05-02 DIAGNOSIS — I10 Essential (primary) hypertension: Secondary | ICD-10-CM

## 2012-05-02 DIAGNOSIS — E785 Hyperlipidemia, unspecified: Secondary | ICD-10-CM

## 2012-05-02 MED ORDER — ZOLPIDEM TARTRATE 5 MG PO TABS: ORAL_TABLET | ORAL | Status: DC

## 2012-05-02 NOTE — Telephone Encounter (Signed)
Last Ov 04-13-12

## 2012-05-02 NOTE — Telephone Encounter (Signed)
Refill- zolpidem tartrate 5mg  tab. Qty 30 last fill 12.26.13

## 2012-05-02 NOTE — Telephone Encounter (Signed)
Rx sent 

## 2012-05-02 NOTE — Telephone Encounter (Signed)
refillx1

## 2012-05-03 ENCOUNTER — Telehealth: Payer: Self-pay | Admitting: Family Medicine

## 2012-05-03 MED ORDER — BUSPIRONE HCL 30 MG PO TABS: 30.0000 mg | ORAL_TABLET | Freq: Two times a day (BID) | ORAL | Status: DC

## 2012-05-03 NOTE — Telephone Encounter (Signed)
Rx sent 

## 2012-05-03 NOTE — Telephone Encounter (Signed)
REFILL ON BUSPIRONE  HCL 30 MG TAB # 60  SIG: TAKE ONE (1) TABLET BY MOUTH TWO (2)  TIMES DAILY LAST FILLED 03.03.2014    REFILLS LEFT  :0

## 2012-05-05 ENCOUNTER — Telehealth: Payer: Self-pay | Admitting: Family Medicine

## 2012-05-05 DIAGNOSIS — E039 Hypothyroidism, unspecified: Secondary | ICD-10-CM

## 2012-05-05 MED ORDER — LEVOTHYROXINE SODIUM 88 MCG PO TABS: 88.0000 ug | ORAL_TABLET | Freq: Every day | ORAL | Status: DC

## 2012-05-05 NOTE — Telephone Encounter (Signed)
REFILLS NEEDED ON LEVOTHYROXINE SODIUM 88 MG TAB #30  SIG; TAKE ONE (1) TABLET BY MOUTH EVERY DAY. LAST FILLED 02.27.2014        AUTH REFILLS 11 ARE OUT OF DATE

## 2012-06-10 ENCOUNTER — Telehealth: Payer: Self-pay | Admitting: General Practice

## 2012-06-10 DIAGNOSIS — F419 Anxiety disorder, unspecified: Secondary | ICD-10-CM

## 2012-06-10 NOTE — Telephone Encounter (Signed)
Ativan refill request. Pt last seen on 04/13/12. Med last filled on 02/16/12 #30 with 2 refills. Ok to fill?

## 2012-06-10 NOTE — Telephone Encounter (Signed)
Ok to refill x 1  

## 2012-06-13 MED ORDER — LORAZEPAM 1 MG PO TABS: 1.0000 mg | ORAL_TABLET | ORAL | Status: DC | PRN

## 2012-07-12 ENCOUNTER — Telehealth: Payer: Self-pay | Admitting: *Deleted

## 2012-07-12 DIAGNOSIS — F419 Anxiety disorder, unspecified: Secondary | ICD-10-CM

## 2012-07-12 MED ORDER — GLIPIZIDE ER 2.5 MG PO TB24: 2.5000 mg | ORAL_TABLET | Freq: Every day | ORAL | Status: DC

## 2012-07-12 MED ORDER — BUSPIRONE HCL 30 MG PO TABS: 30.0000 mg | ORAL_TABLET | Freq: Two times a day (BID) | ORAL | Status: DC

## 2012-07-12 MED ORDER — LORAZEPAM 1 MG PO TABS: 1.0000 mg | ORAL_TABLET | ORAL | Status: DC | PRN

## 2012-07-12 NOTE — Telephone Encounter (Signed)
Ativan Last filled 06-13-12 #30, last ov 04-13-12

## 2012-07-12 NOTE — Telephone Encounter (Signed)
Ok to refill 

## 2012-07-14 ENCOUNTER — Encounter: Payer: Self-pay | Admitting: Family Medicine

## 2012-07-14 ENCOUNTER — Ambulatory Visit (INDEPENDENT_AMBULATORY_CARE_PROVIDER_SITE_OTHER): Payer: Medicare Other | Admitting: Family Medicine

## 2012-07-14 VITALS — BP 140/68 | HR 68 | Temp 98.3°F | Wt 211.0 lb

## 2012-07-14 DIAGNOSIS — E118 Type 2 diabetes mellitus with unspecified complications: Secondary | ICD-10-CM

## 2012-07-14 DIAGNOSIS — E039 Hypothyroidism, unspecified: Secondary | ICD-10-CM

## 2012-07-14 DIAGNOSIS — Z0000 Encounter for general adult medical examination without abnormal findings: Secondary | ICD-10-CM

## 2012-07-14 DIAGNOSIS — E785 Hyperlipidemia, unspecified: Secondary | ICD-10-CM

## 2012-07-14 DIAGNOSIS — E1165 Type 2 diabetes mellitus with hyperglycemia: Secondary | ICD-10-CM

## 2012-07-14 DIAGNOSIS — I10 Essential (primary) hypertension: Secondary | ICD-10-CM

## 2012-07-14 DIAGNOSIS — E119 Type 2 diabetes mellitus without complications: Secondary | ICD-10-CM

## 2012-07-14 MED ORDER — ZOLPIDEM TARTRATE 5 MG PO TABS: ORAL_TABLET | ORAL | Status: DC

## 2012-07-14 NOTE — Progress Notes (Signed)
  Subjective:    Patient ID: Kelly Elliott, female    DOB: 02/17/1940, 72 y.o.   MRN: [id number]  HPI HYPERTENSION Disease Monitoring Blood pressure range-good per pt Chest pain- no      Dyspnea- no Medications Compliance- good Lightheadedness- no   Edema- mild   DIABETES Disease Monitoring Blood Sugar ranges-90 this am Polyuria- no New Visual problems- no Medications Compliance- good Hypoglycemic symptoms- no   HYPERLIPIDEMIA Disease Monitoring See symptoms for Hypertension Medications Compliance- good RUQ pain- no  Muscle aches- no  ROS See HPI above   PMH Smoking Status noted     Review of Systems As above    Objective:   Physical Exam  BP 140/68  Pulse 68  Temp(Src) 98.3 F (36.8 C) (Oral)  Wt 211 lb (95.709 kg)  BMI 37.39 kg/m2  SpO2 98% General appearance: alert, cooperative, appears stated age and no distress Lungs: clear to auscultation bilaterally Heart: S1, S2 normal Extremities: extremities normal, atraumatic, no cyanosis or edema Neurologic: Alert and oriented X 3, normal strength and tone. Normal symmetric reflexes. Normal coordination and gait Sensory exam of the foot is normal, tested with the monofilament. Good pulses, no lesions or ulcers, good peripheral pulses.        Assessment & Plan:

## 2012-07-14 NOTE — Patient Instructions (Addendum)

## 2012-07-14 NOTE — Assessment & Plan Note (Signed)
Stable con't meds 

## 2012-07-14 NOTE — Assessment & Plan Note (Signed)
Check labs con't meds 

## 2012-07-19 ENCOUNTER — Other Ambulatory Visit (INDEPENDENT_AMBULATORY_CARE_PROVIDER_SITE_OTHER): Payer: Medicare Other

## 2012-07-19 DIAGNOSIS — E785 Hyperlipidemia, unspecified: Secondary | ICD-10-CM

## 2012-07-19 DIAGNOSIS — I10 Essential (primary) hypertension: Secondary | ICD-10-CM

## 2012-07-19 DIAGNOSIS — E119 Type 2 diabetes mellitus without complications: Secondary | ICD-10-CM

## 2012-07-19 LAB — CBC WITH DIFFERENTIAL/PLATELET
Basophils Relative: 0.5 % (ref 0.0–3.0)
Eosinophils Absolute: 0.2 10*3/uL (ref 0.0–0.7)
Eosinophils Relative: 2.5 % (ref 0.0–5.0)
HCT: 37.7 % (ref 36.0–46.0)
Hemoglobin: 12.9 g/dL (ref 12.0–15.0)
MCHC: 34.2 g/dL (ref 30.0–36.0)
MCV: 97 fl (ref 78.0–100.0)
Monocytes Absolute: 1 10*3/uL (ref 0.1–1.0)
Neutro Abs: 5.3 10*3/uL (ref 1.4–7.7)
RBC: 3.89 Mil/uL (ref 3.87–5.11)
WBC: 8.7 10*3/uL (ref 4.5–10.5)

## 2012-07-19 LAB — LIPID PANEL
Total CHOL/HDL Ratio: 4
Triglycerides: 133 mg/dL (ref 0.0–149.0)

## 2012-07-19 LAB — BASIC METABOLIC PANEL
CO2: 25 mEq/L (ref 19–32)
Chloride: 98 mEq/L (ref 96–112)
Potassium: 4.1 mEq/L (ref 3.5–5.1)
Sodium: 134 mEq/L — ABNORMAL LOW (ref 135–145)

## 2012-07-19 LAB — HEPATIC FUNCTION PANEL
ALT: 23 U/L (ref 0–35)
AST: 22 U/L (ref 0–37)
Bilirubin, Direct: 0 mg/dL (ref 0.0–0.3)
Total Bilirubin: 0.9 mg/dL (ref 0.3–1.2)

## 2012-07-19 LAB — LDL CHOLESTEROL, DIRECT: Direct LDL: 157.2 mg/dL

## 2012-07-19 LAB — HEMOGLOBIN A1C: Hgb A1c MFr Bld: 5.6 % (ref 4.6–6.5)

## 2012-07-21 ENCOUNTER — Other Ambulatory Visit: Payer: Self-pay

## 2012-07-21 MED ORDER — FENOFIBRATE 160 MG PO TABS: 160.0000 mg | ORAL_TABLET | Freq: Every day | ORAL | Status: DC

## 2012-08-10 ENCOUNTER — Telehealth: Payer: Self-pay | Admitting: Family Medicine

## 2012-08-10 DIAGNOSIS — F419 Anxiety disorder, unspecified: Secondary | ICD-10-CM

## 2012-08-10 NOTE — Telephone Encounter (Signed)
Last seen 07/14/12 and filled 07/12/12 #30. Please advise     KP

## 2012-08-10 NOTE — Telephone Encounter (Signed)
REFILL:LORAZEPAM 1MG  TABLET #30

## 2012-08-10 NOTE — Telephone Encounter (Signed)
Refill x1 

## 2012-08-11 MED ORDER — LORAZEPAM 1 MG PO TABS: 1.0000 mg | ORAL_TABLET | ORAL | Status: DC | PRN

## 2012-09-13 ENCOUNTER — Telehealth: Payer: Self-pay | Admitting: *Deleted

## 2012-09-13 DIAGNOSIS — F419 Anxiety disorder, unspecified: Secondary | ICD-10-CM

## 2012-09-13 MED ORDER — LORAZEPAM 1 MG PO TABS: 1.0000 mg | ORAL_TABLET | ORAL | Status: DC | PRN

## 2012-09-13 NOTE — Telephone Encounter (Signed)
Ok for #30, no refill 

## 2012-09-13 NOTE — Telephone Encounter (Signed)
Pharmacy is requesting refill for lorazepam 1 mg.  Last ov 07/14/12 last date filled 08/11/12 #30 0 R last UDS 02/04/12 Patient does have a controlled substance contract on file and this is one of Dr. Hulan Saas patients. Please advise.

## 2012-09-27 ENCOUNTER — Telehealth: Payer: Self-pay | Admitting: *Deleted

## 2012-09-27 DIAGNOSIS — E785 Hyperlipidemia, unspecified: Secondary | ICD-10-CM

## 2012-09-27 DIAGNOSIS — I10 Essential (primary) hypertension: Secondary | ICD-10-CM

## 2012-09-27 DIAGNOSIS — E039 Hypothyroidism, unspecified: Secondary | ICD-10-CM

## 2012-09-27 DIAGNOSIS — Z0000 Encounter for general adult medical examination without abnormal findings: Secondary | ICD-10-CM

## 2012-09-27 DIAGNOSIS — E1165 Type 2 diabetes mellitus with hyperglycemia: Secondary | ICD-10-CM

## 2012-09-27 MED ORDER — ZOLPIDEM TARTRATE 5 MG PO TABS: ORAL_TABLET | ORAL | Status: DC

## 2012-09-27 NOTE — Telephone Encounter (Signed)
Pharmacy is requesting a refill for Zolpidem 50 mg.  Last ov 07/14/12 last date filled 07/14/12 #30 0 R last UDS 02/04/12 patient is a low risk and has a controlled substance contract on file.  Please Advise   Ag cma

## 2012-09-27 NOTE — Telephone Encounter (Signed)
Ok to refill x 1  

## 2012-10-13 ENCOUNTER — Telehealth: Payer: Self-pay | Admitting: *Deleted

## 2012-10-13 ENCOUNTER — Other Ambulatory Visit: Payer: Self-pay | Admitting: *Deleted

## 2012-10-13 DIAGNOSIS — F419 Anxiety disorder, unspecified: Secondary | ICD-10-CM

## 2012-10-13 MED ORDER — LORAZEPAM 1 MG PO TABS: 1.0000 mg | ORAL_TABLET | ORAL | Status: DC | PRN

## 2012-10-13 MED ORDER — GLIPIZIDE ER 2.5 MG PO TB24: 2.5000 mg | ORAL_TABLET | Freq: Every day | ORAL | Status: DC

## 2012-10-13 NOTE — Telephone Encounter (Signed)
Rx refilled for Glipizide er 2.5 mg.  Ag cma

## 2012-10-13 NOTE — Telephone Encounter (Signed)
Refill x1 

## 2012-10-13 NOTE — Telephone Encounter (Signed)
Pharmacy is requesting refill for lorazepam 1 mg. Last ov 07/14/12 last date filled 09/13/12 #30 0 R last UDS 02/04/12 Patient does have a controlled substance contract on file.    Please Advise   cma

## 2012-10-18 ENCOUNTER — Telehealth: Payer: Self-pay | Admitting: *Deleted

## 2012-10-18 MED ORDER — BUSPIRONE HCL 30 MG PO TABS: 30.0000 mg | ORAL_TABLET | Freq: Two times a day (BID) | ORAL | Status: DC

## 2012-10-18 NOTE — Telephone Encounter (Signed)
Rx request for Buspirone 30mg  Last ov 07/14/12 Last date filled 07/12/12 #60 2 R Last UDS 02/04/12 low risk  Patient has contract on file.    Ag cma

## 2012-10-18 NOTE — Telephone Encounter (Signed)
Ok to refill x 6 months 

## 2012-10-18 NOTE — Addendum Note (Signed)
Addended by: Arnette Norris on: 10/18/2012 04:08 PM   Modules accepted: Orders

## 2012-11-15 ENCOUNTER — Other Ambulatory Visit: Payer: Self-pay | Admitting: Cardiovascular Disease

## 2012-11-18 ENCOUNTER — Telehealth: Payer: Self-pay | Admitting: *Deleted

## 2012-11-18 DIAGNOSIS — F419 Anxiety disorder, unspecified: Secondary | ICD-10-CM

## 2012-11-18 MED ORDER — LORAZEPAM 1 MG PO TABS: 1.0000 mg | ORAL_TABLET | ORAL | Status: DC | PRN

## 2012-11-18 NOTE — Telephone Encounter (Signed)
Last visit: 07/14/2012  Last filled: 10/13/2012 #30, 0 refills  UDS 02/03/2013-low risk, contract signed  Please advise SW

## 2012-11-18 NOTE — Telephone Encounter (Signed)
Rx faxed.    KP 

## 2012-11-18 NOTE — Telephone Encounter (Signed)
refillx1

## 2012-12-14 ENCOUNTER — Other Ambulatory Visit: Payer: Self-pay | Admitting: Cardiovascular Disease

## 2012-12-16 ENCOUNTER — Other Ambulatory Visit: Payer: Self-pay | Admitting: Family Medicine

## 2012-12-16 MED ORDER — LISINOPRIL 20 MG PO TABS: ORAL_TABLET | ORAL | Status: DC

## 2012-12-16 NOTE — Telephone Encounter (Signed)
According to last ov---she is due in December for both

## 2012-12-16 NOTE — Telephone Encounter (Signed)
Please offer her an appointment and I will fill her med's until December.      KP

## 2012-12-16 NOTE — Telephone Encounter (Signed)
Patient called about a refill for lisinopril (PRINIVIL,ZESTRIL) 20 MG tablet. Patient states that she does not see her cardiologist anymore. So she wanted to see if dr Laury Axon would take over refilling her lisinopril. Also patient states that she wanted to know when should she come back in and see dr Laury Axon or have labs done.      Pharmacy [facility] - [place], [place] - [address]

## 2012-12-16 NOTE — Telephone Encounter (Signed)
Please advise      KP 

## 2012-12-30 ENCOUNTER — Telehealth: Payer: Self-pay | Admitting: *Deleted

## 2012-12-30 DIAGNOSIS — F419 Anxiety disorder, unspecified: Secondary | ICD-10-CM

## 2012-12-30 MED ORDER — LORAZEPAM 1 MG PO TABS: 1.0000 mg | ORAL_TABLET | ORAL | Status: DC | PRN

## 2012-12-30 NOTE — Telephone Encounter (Signed)
Refill x1 

## 2012-12-30 NOTE — Telephone Encounter (Signed)
Patient is requesting refill for Lorazepam.  Last seen-07/14/2012  Last filled-11/18/2012  UDS-02/04/2012 low risk, contract signed  Please advise. SW

## 2012-12-30 NOTE — Telephone Encounter (Signed)
Rx faxed.    KP 

## 2013-01-17 ENCOUNTER — Other Ambulatory Visit: Payer: Self-pay

## 2013-01-17 DIAGNOSIS — E785 Hyperlipidemia, unspecified: Secondary | ICD-10-CM

## 2013-01-17 DIAGNOSIS — E039 Hypothyroidism, unspecified: Secondary | ICD-10-CM

## 2013-01-17 DIAGNOSIS — Z0000 Encounter for general adult medical examination without abnormal findings: Secondary | ICD-10-CM

## 2013-01-17 DIAGNOSIS — I10 Essential (primary) hypertension: Secondary | ICD-10-CM

## 2013-01-17 DIAGNOSIS — E1165 Type 2 diabetes mellitus with hyperglycemia: Secondary | ICD-10-CM

## 2013-01-17 MED ORDER — ZOLPIDEM TARTRATE 5 MG PO TABS: ORAL_TABLET | ORAL | Status: DC

## 2013-01-17 MED ORDER — GLIPIZIDE ER 2.5 MG PO TB24: 2.5000 mg | ORAL_TABLET | Freq: Every day | ORAL | Status: DC

## 2013-01-17 NOTE — Telephone Encounter (Signed)
Last 07/14/12 and filled 09/27/12 #30. Please advise      KP

## 2013-01-19 ENCOUNTER — Other Ambulatory Visit: Payer: Self-pay

## 2013-01-19 MED ORDER — LISINOPRIL 20 MG PO TABS: ORAL_TABLET | ORAL | Status: DC

## 2013-02-15 ENCOUNTER — Other Ambulatory Visit: Payer: Self-pay | Admitting: Family Medicine

## 2013-02-15 DIAGNOSIS — F419 Anxiety disorder, unspecified: Secondary | ICD-10-CM

## 2013-02-15 MED ORDER — LORAZEPAM 1 MG PO TABS: 1.0000 mg | ORAL_TABLET | ORAL | Status: DC | PRN

## 2013-02-15 MED ORDER — GLIPIZIDE ER 2.5 MG PO TB24: 2.5000 mg | ORAL_TABLET | Freq: Every day | ORAL | Status: DC

## 2013-02-15 NOTE — Telephone Encounter (Signed)
[facility] is calling to notify us that the patient needs refills sent on her Lorazepam and glipiZIDE rx's. Please advise.

## 2013-02-15 NOTE — Telephone Encounter (Signed)
Last seen 07/14/12 and filled 12/30/12 #30. UDS 02/04/12.  Please advise      KP

## 2013-02-21 ENCOUNTER — Ambulatory Visit: Payer: Medicare Other | Admitting: Family Medicine

## 2013-03-06 ENCOUNTER — Ambulatory Visit (INDEPENDENT_AMBULATORY_CARE_PROVIDER_SITE_OTHER): Payer: Medicare Other | Admitting: Family Medicine

## 2013-03-06 ENCOUNTER — Encounter: Payer: Self-pay | Admitting: Family Medicine

## 2013-03-06 VITALS — BP 140/86 | HR 72 | Temp 98.5°F | Wt 215.4 lb

## 2013-03-06 DIAGNOSIS — F411 Generalized anxiety disorder: Secondary | ICD-10-CM

## 2013-03-06 DIAGNOSIS — E119 Type 2 diabetes mellitus without complications: Secondary | ICD-10-CM

## 2013-03-06 DIAGNOSIS — E1159 Type 2 diabetes mellitus with other circulatory complications: Secondary | ICD-10-CM

## 2013-03-06 DIAGNOSIS — N390 Urinary tract infection, site not specified: Secondary | ICD-10-CM

## 2013-03-06 DIAGNOSIS — E785 Hyperlipidemia, unspecified: Secondary | ICD-10-CM

## 2013-03-06 DIAGNOSIS — F419 Anxiety disorder, unspecified: Secondary | ICD-10-CM

## 2013-03-06 DIAGNOSIS — E7800 Pure hypercholesterolemia, unspecified: Secondary | ICD-10-CM

## 2013-03-06 DIAGNOSIS — I10 Essential (primary) hypertension: Secondary | ICD-10-CM

## 2013-03-06 LAB — HEMOGLOBIN A1C: HEMOGLOBIN A1C: 6.1 % (ref 4.6–6.5)

## 2013-03-06 LAB — BASIC METABOLIC PANEL
BUN: 15 mg/dL (ref 6–23)
CHLORIDE: 93 meq/L — AB (ref 96–112)
CO2: 29 meq/L (ref 19–32)
Calcium: 9 mg/dL (ref 8.4–10.5)
Creatinine, Ser: 0.8 mg/dL (ref 0.4–1.2)
GFR: 76.38 mL/min (ref >60.00)
Glucose, Bld: 131 mg/dL — ABNORMAL HIGH (ref 70–99)
POTASSIUM: 4.4 meq/L (ref 3.5–5.1)
SODIUM: 129 meq/L — AB (ref 135–145)

## 2013-03-06 LAB — LDL CHOLESTEROL, DIRECT: Direct LDL: 218.4 mg/dL

## 2013-03-06 LAB — HEPATIC FUNCTION PANEL
ALBUMIN: 4.2 g/dL (ref 3.5–5.2)
ALK PHOS: 63 U/L (ref 39–117)
ALT: 26 U/L (ref 0–35)
AST: 23 U/L (ref 0–37)
Bilirubin, Direct: 0 mg/dL (ref 0.0–0.3)
TOTAL PROTEIN: 7.8 g/dL (ref 6.0–8.3)
Total Bilirubin: 0.6 mg/dL (ref 0.3–1.2)

## 2013-03-06 LAB — LIPID PANEL
Cholesterol: 302 mg/dL — ABNORMAL HIGH (ref 0–200)
HDL: 72.1 mg/dL (ref >39.00)
Total CHOL/HDL Ratio: 4
Triglycerides: 112 mg/dL (ref 0.0–149.0)
VLDL: 22.4 mg/dL (ref 0.0–40.0)

## 2013-03-06 MED ORDER — GLIPIZIDE ER 2.5 MG PO TB24: 2.5000 mg | ORAL_TABLET | Freq: Every day | ORAL | Status: DC

## 2013-03-06 MED ORDER — LISINOPRIL 20 MG PO TABS: ORAL_TABLET | ORAL | Status: DC

## 2013-03-06 MED ORDER — LORAZEPAM 1 MG PO TABS: 1.0000 mg | ORAL_TABLET | ORAL | Status: DC | PRN

## 2013-03-06 NOTE — Patient Instructions (Signed)

## 2013-03-06 NOTE — Assessment & Plan Note (Signed)
Check labs con't meds 

## 2013-03-06 NOTE — Assessment & Plan Note (Signed)
Running slightly high Pt tends to run high in office con't meds Pt will start checking more at home again rto 6 months or sooner prn

## 2013-03-06 NOTE — Progress Notes (Signed)
Pre visit review using our clinic review tool, if applicable. No additional management support is needed unless otherwise documented below in the visit note. 

## 2013-03-06 NOTE — Progress Notes (Signed)
   Subjective:    Patient ID: Kelly Elliott, female    DOB: [birth year], 73 y.o.   MRN: [id number]  HPI  HPI HYPERTENSION  Blood pressure range-not checking  Chest pain- no      Dyspnea- no Lightheadedness- no   Edema- no Other side effects - no   Medication compliance: good Low salt diet- no  DIABETES  Blood Sugar ranges-not checking regularly  Polyuria- no New Visual problems- no Hypoglycemic symptoms- no Other side effects-no Medication compliance - good Last eye exam- 07/2012 Foot exam- today  HYPERLIPIDEMIA  Medication compliance- good RUQ pain- no  Muscle aches- no Other side effects-no  Pt has been under a lot of stress-- just sold her house and has recently started exercising and following a diet again.  Her sleep is better now that she has moved. ROS See HPI above   PMH Smoking Status noted       Review of Systems As above     Objective:   Physical Exam  BP 140/86  Pulse 72  Temp(Src) 98.5 F (36.9 C) (Oral)  Wt 215 lb 6.4 oz (97.705 kg)  SpO2 96% General appearance: alert, cooperative, appears stated age and no distress Neck: no adenopathy, supple, symmetrical, trachea midline and thyroid not enlarged, symmetric, no tenderness/mass/nodules Lungs: clear to auscultation bilaterally Heart: S1, S2 normal Extremities: extremities normal, atraumatic, no cyanosis or edema Sensory exam of the foot is normal, tested with the monofilament. Good pulses, no lesions or ulcers, good peripheral pulses.       Assessment & Plan:

## 2013-03-06 NOTE — Assessment & Plan Note (Signed)
con't meds Check labs Pt due for eye exam

## 2013-03-09 ENCOUNTER — Telehealth: Payer: Self-pay

## 2013-03-09 NOTE — Telephone Encounter (Signed)
Relevant patient education assigned to patient using Emmi. ° °

## 2013-03-10 LAB — POCT URINALYSIS DIPSTICK
BILIRUBIN UA: NEGATIVE
Blood, UA: NEGATIVE
GLUCOSE UA: NEGATIVE
KETONES UA: NEGATIVE
Nitrite, UA: NEGATIVE
PROTEIN UA: NEGATIVE
SPEC GRAV UA: 1.01
Urobilinogen, UA: 0.2
pH, UA: 5

## 2013-03-10 NOTE — Addendum Note (Signed)
Addended by: Modena Morrow D on: 03/10/2013 04:28 PM   Modules accepted: Orders

## 2013-03-12 LAB — URINE CULTURE

## 2013-03-15 ENCOUNTER — Telehealth: Payer: Self-pay | Admitting: Family Medicine

## 2013-03-15 NOTE — Telephone Encounter (Signed)
Relevant patient education assigned to patient using Emmi. ° °

## 2013-03-30 ENCOUNTER — Telehealth: Payer: Self-pay | Admitting: General Practice

## 2013-03-30 DIAGNOSIS — F419 Anxiety disorder, unspecified: Secondary | ICD-10-CM

## 2013-03-30 MED ORDER — LORAZEPAM 1 MG PO TABS: 1.0000 mg | ORAL_TABLET | ORAL | Status: DC | PRN

## 2013-03-30 NOTE — Telephone Encounter (Signed)
Refill x1 

## 2013-03-30 NOTE — Telephone Encounter (Signed)
Rx printed and placed on ledge for signature  

## 2013-03-30 NOTE — Telephone Encounter (Signed)
Molly from [facility] pharmacy called in regards to the pt. Stated that they were trying to receive a refill on pt's Lorazepam. Stated that they tried faxing the office and a rx was sent back to them initialed KP stating that the med was filled in January. I called and advised molly that the med was requested to early and would forward the message to the provider.    Last OV 03/06/13 Lorazepam filled the dame day #30 with 0

## 2013-04-18 ENCOUNTER — Telehealth: Payer: Self-pay

## 2013-04-18 DIAGNOSIS — Z0000 Encounter for general adult medical examination without abnormal findings: Secondary | ICD-10-CM

## 2013-04-18 DIAGNOSIS — E1165 Type 2 diabetes mellitus with hyperglycemia: Secondary | ICD-10-CM

## 2013-04-18 DIAGNOSIS — E039 Hypothyroidism, unspecified: Secondary | ICD-10-CM

## 2013-04-18 DIAGNOSIS — I10 Essential (primary) hypertension: Secondary | ICD-10-CM

## 2013-04-18 DIAGNOSIS — E785 Hyperlipidemia, unspecified: Secondary | ICD-10-CM

## 2013-04-18 DIAGNOSIS — IMO0002 Reserved for concepts with insufficient information to code with codable children: Secondary | ICD-10-CM

## 2013-04-18 DIAGNOSIS — E118 Type 2 diabetes mellitus with unspecified complications: Secondary | ICD-10-CM

## 2013-04-18 MED ORDER — ZOLPIDEM TARTRATE 5 MG PO TABS: ORAL_TABLET | ORAL | Status: DC

## 2013-04-18 NOTE — Telephone Encounter (Signed)
Rx faxed.    KP 

## 2013-04-18 NOTE — Telephone Encounter (Signed)
Refill x1 

## 2013-04-18 NOTE — Telephone Encounter (Signed)
Last seen 03/06/13 and filled 01/27/13 #30. Please advise      KP

## 2013-05-16 ENCOUNTER — Other Ambulatory Visit: Payer: Self-pay

## 2013-05-16 MED ORDER — BUSPIRONE HCL 30 MG PO TABS: 30.0000 mg | ORAL_TABLET | Freq: Two times a day (BID) | ORAL | Status: DC

## 2013-05-19 ENCOUNTER — Other Ambulatory Visit: Payer: Self-pay

## 2013-05-19 DIAGNOSIS — E039 Hypothyroidism, unspecified: Secondary | ICD-10-CM

## 2013-05-19 MED ORDER — LEVOTHYROXINE SODIUM 88 MCG PO TABS: ORAL_TABLET | ORAL | Status: DC

## 2013-06-06 ENCOUNTER — Telehealth: Payer: Self-pay | Admitting: Family Medicine

## 2013-06-06 DIAGNOSIS — E039 Hypothyroidism, unspecified: Secondary | ICD-10-CM

## 2013-06-06 DIAGNOSIS — E119 Type 2 diabetes mellitus without complications: Secondary | ICD-10-CM

## 2013-06-06 DIAGNOSIS — E785 Hyperlipidemia, unspecified: Secondary | ICD-10-CM

## 2013-06-06 NOTE — Addendum Note (Signed)
Addended by: Ewing Schlein on: 06/06/2013 02:42 PM   Modules accepted: Orders

## 2013-06-06 NOTE — Telephone Encounter (Signed)
Future orders placed, please schedule fasting lab appt when pt calls back.

## 2013-06-06 NOTE — Telephone Encounter (Signed)
Caller name:Kelly Elliott  Relation to IO:[id number] Call back number: Pharmacy:  Reason for call:  Patient called and requested a lab apt in order to have her meds refilled. Please advise.

## 2013-06-06 NOTE — Telephone Encounter (Signed)
patient is due for a lipid, hep, bmp, a1c and Tsh. Orders will be put in.      KP

## 2013-06-09 ENCOUNTER — Other Ambulatory Visit (INDEPENDENT_AMBULATORY_CARE_PROVIDER_SITE_OTHER): Payer: Medicare Other

## 2013-06-09 DIAGNOSIS — E039 Hypothyroidism, unspecified: Secondary | ICD-10-CM

## 2013-06-09 DIAGNOSIS — E785 Hyperlipidemia, unspecified: Secondary | ICD-10-CM

## 2013-06-09 DIAGNOSIS — E119 Type 2 diabetes mellitus without complications: Secondary | ICD-10-CM

## 2013-06-09 LAB — HEPATIC FUNCTION PANEL
ALT: 18 U/L (ref 0–35)
AST: 22 U/L (ref 0–37)
Albumin: 4.2 g/dL (ref 3.5–5.2)
Alkaline Phosphatase: 58 U/L (ref 39–117)
BILIRUBIN TOTAL: 0.7 mg/dL (ref 0.3–1.2)
Bilirubin, Direct: 0 mg/dL (ref 0.0–0.3)
Total Protein: 7.5 g/dL (ref 6.0–8.3)

## 2013-06-09 LAB — CHOLESTEROL, TOTAL: CHOLESTEROL: 276 mg/dL — AB (ref 0–200)

## 2013-06-09 LAB — BASIC METABOLIC PANEL
BUN: 19 mg/dL (ref 6–23)
CHLORIDE: 94 meq/L — AB (ref 96–112)
CO2: 27 mEq/L (ref 19–32)
CREATININE: 0.7 mg/dL (ref 0.4–1.2)
Calcium: 8.9 mg/dL (ref 8.4–10.5)
GFR: 86.33 mL/min (ref >60.00)
Glucose, Bld: 119 mg/dL — ABNORMAL HIGH (ref 70–99)
Potassium: 4.3 mEq/L (ref 3.5–5.1)
SODIUM: 131 meq/L — AB (ref 135–145)

## 2013-06-09 LAB — TSH: TSH: 3.09 u[IU]/mL (ref 0.35–5.50)

## 2013-06-09 LAB — HEMOGLOBIN A1C: Hgb A1c MFr Bld: 5.7 % (ref 4.6–6.5)

## 2013-06-13 ENCOUNTER — Other Ambulatory Visit: Payer: Self-pay | Admitting: *Deleted

## 2013-06-13 DIAGNOSIS — F419 Anxiety disorder, unspecified: Secondary | ICD-10-CM

## 2013-06-13 DIAGNOSIS — E039 Hypothyroidism, unspecified: Secondary | ICD-10-CM

## 2013-06-13 MED ORDER — LEVOTHYROXINE SODIUM 88 MCG PO TABS: ORAL_TABLET | ORAL | Status: DC

## 2013-06-13 MED ORDER — LORAZEPAM 1 MG PO TABS: 1.0000 mg | ORAL_TABLET | ORAL | Status: DC | PRN

## 2013-06-13 NOTE — Telephone Encounter (Signed)
Requesting Lorazepam 1mg -Take 1 tablet by mouth as needed. Last refill:05-10-13;#30 Last OV:06-09-13 UDS:02-04-12-Low-Next screening:06-09-13 Please advise.//AB/CMA

## 2013-06-14 NOTE — Telephone Encounter (Signed)
Rx printed and faxed to the pharmacy.//AB/CMA 

## 2013-06-15 ENCOUNTER — Ambulatory Visit: Payer: Medicare Other

## 2013-06-15 DIAGNOSIS — E785 Hyperlipidemia, unspecified: Secondary | ICD-10-CM

## 2013-06-15 LAB — LIPID PANEL
CHOLESTEROL: 287 mg/dL — AB (ref 0–200)
HDL: 65.3 mg/dL (ref >39.00)
LDL Cholesterol: 195 mg/dL — ABNORMAL HIGH (ref 0–99)
Total CHOL/HDL Ratio: 4
Triglycerides: 135 mg/dL (ref 0.0–149.0)
VLDL: 27 mg/dL (ref 0.0–40.0)

## 2013-06-29 ENCOUNTER — Telehealth: Payer: Self-pay

## 2013-06-29 NOTE — Telephone Encounter (Signed)
Sent credit sheet to [facility] [employer] for lipid panel

## 2013-07-20 ENCOUNTER — Other Ambulatory Visit: Payer: Self-pay

## 2013-07-20 DIAGNOSIS — E785 Hyperlipidemia, unspecified: Secondary | ICD-10-CM

## 2013-07-20 MED ORDER — ATORVASTATIN CALCIUM 40 MG PO TABS: 40.0000 mg | ORAL_TABLET | Freq: Every day | ORAL | Status: DC

## 2013-07-27 ENCOUNTER — Telehealth: Payer: Self-pay

## 2013-07-27 DIAGNOSIS — I10 Essential (primary) hypertension: Secondary | ICD-10-CM

## 2013-07-27 DIAGNOSIS — F419 Anxiety disorder, unspecified: Secondary | ICD-10-CM

## 2013-07-27 DIAGNOSIS — E118 Type 2 diabetes mellitus with unspecified complications: Secondary | ICD-10-CM

## 2013-07-27 DIAGNOSIS — Z0000 Encounter for general adult medical examination without abnormal findings: Secondary | ICD-10-CM

## 2013-07-27 DIAGNOSIS — IMO0002 Reserved for concepts with insufficient information to code with codable children: Secondary | ICD-10-CM

## 2013-07-27 DIAGNOSIS — E039 Hypothyroidism, unspecified: Secondary | ICD-10-CM

## 2013-07-27 DIAGNOSIS — E1165 Type 2 diabetes mellitus with hyperglycemia: Secondary | ICD-10-CM

## 2013-07-27 DIAGNOSIS — E785 Hyperlipidemia, unspecified: Secondary | ICD-10-CM

## 2013-07-27 MED ORDER — LORAZEPAM 1 MG PO TABS: 1.0000 mg | ORAL_TABLET | ORAL | Status: DC | PRN

## 2013-07-27 MED ORDER — ZOLPIDEM TARTRATE 5 MG PO TABS: ORAL_TABLET | ORAL | Status: DC

## 2013-07-27 NOTE — Telephone Encounter (Signed)
Last seen 03/06/13 and filled Ambien 04/18/13 #30 and Lorazepam 06/13/13 #30. Please advise     KP

## 2013-07-27 NOTE — Telephone Encounter (Signed)
Faxed.   KP 

## 2013-07-27 NOTE — Telephone Encounter (Signed)
Refill both x1 

## 2013-09-05 ENCOUNTER — Telehealth: Payer: Self-pay

## 2013-09-05 NOTE — Telephone Encounter (Signed)
Left message for pt to call back and schedule a CPE with PCP.

## 2013-09-08 ENCOUNTER — Other Ambulatory Visit: Payer: Self-pay

## 2013-09-08 DIAGNOSIS — E1159 Type 2 diabetes mellitus with other circulatory complications: Secondary | ICD-10-CM

## 2013-09-08 DIAGNOSIS — F419 Anxiety disorder, unspecified: Secondary | ICD-10-CM

## 2013-09-08 MED ORDER — GLIPIZIDE ER 2.5 MG PO TB24: 2.5000 mg | ORAL_TABLET | Freq: Every day | ORAL | Status: DC

## 2013-09-08 MED ORDER — LORAZEPAM 1 MG PO TABS: 1.0000 mg | ORAL_TABLET | ORAL | Status: DC | PRN

## 2013-10-10 ENCOUNTER — Other Ambulatory Visit: Payer: Self-pay

## 2013-10-10 MED ORDER — LISINOPRIL 20 MG PO TABS: ORAL_TABLET | ORAL | Status: DC

## 2013-10-20 ENCOUNTER — Telehealth: Payer: Self-pay

## 2013-10-20 DIAGNOSIS — F419 Anxiety disorder, unspecified: Secondary | ICD-10-CM

## 2013-10-20 MED ORDER — LORAZEPAM 1 MG PO TABS: 1.0000 mg | ORAL_TABLET | ORAL | Status: DC | PRN

## 2013-10-20 NOTE — Telephone Encounter (Signed)
Last seen 06/09/13 and filled 7/31/5 #30. Please advise    KP

## 2013-10-20 NOTE — Telephone Encounter (Signed)
Ok for [employer]

## 2013-11-02 ENCOUNTER — Telehealth: Payer: Self-pay

## 2013-11-02 DIAGNOSIS — E039 Hypothyroidism, unspecified: Secondary | ICD-10-CM

## 2013-11-02 DIAGNOSIS — E1165 Type 2 diabetes mellitus with hyperglycemia: Secondary | ICD-10-CM

## 2013-11-02 DIAGNOSIS — I10 Essential (primary) hypertension: Secondary | ICD-10-CM

## 2013-11-02 DIAGNOSIS — IMO0002 Reserved for concepts with insufficient information to code with codable children: Secondary | ICD-10-CM

## 2013-11-02 DIAGNOSIS — Z0000 Encounter for general adult medical examination without abnormal findings: Secondary | ICD-10-CM

## 2013-11-02 DIAGNOSIS — E118 Type 2 diabetes mellitus with unspecified complications: Secondary | ICD-10-CM

## 2013-11-02 MED ORDER — ZOLPIDEM TARTRATE 5 MG PO TABS: ORAL_TABLET | ORAL | Status: DC

## 2013-11-02 NOTE — Telephone Encounter (Signed)
Rx faxed.    KP 

## 2013-11-02 NOTE — Telephone Encounter (Signed)
Refill x1 

## 2013-11-02 NOTE — Telephone Encounter (Signed)
Last seen 03/06/13 and filled 07/27/13 #30. Please advise      KP

## 2013-11-21 ENCOUNTER — Other Ambulatory Visit: Payer: Self-pay

## 2013-11-21 DIAGNOSIS — F419 Anxiety disorder, unspecified: Secondary | ICD-10-CM

## 2013-11-21 MED ORDER — LISINOPRIL 20 MG PO TABS: ORAL_TABLET | ORAL | Status: DC

## 2013-11-21 MED ORDER — LORAZEPAM 1 MG PO TABS: 1.0000 mg | ORAL_TABLET | ORAL | Status: DC | PRN

## 2013-11-21 NOTE — Telephone Encounter (Signed)
Needs UDS 

## 2013-11-21 NOTE — Telephone Encounter (Signed)
Last seen 03/06/13 and 10/20/13 #30  UDS 02/04/12   Please advise     KP

## 2013-12-18 ENCOUNTER — Other Ambulatory Visit: Payer: Self-pay

## 2013-12-18 MED ORDER — BUSPIRONE HCL 30 MG PO TABS: 30.0000 mg | ORAL_TABLET | Freq: Two times a day (BID) | ORAL | Status: DC

## 2013-12-20 ENCOUNTER — Other Ambulatory Visit: Payer: Self-pay | Admitting: Family Medicine

## 2013-12-21 ENCOUNTER — Other Ambulatory Visit: Payer: Self-pay | Admitting: Family Medicine

## 2013-12-21 NOTE — Telephone Encounter (Signed)
Last seen 03/06/13 and filled 11/21/13 #30  UDS 02/04/12   Please advise    KP

## 2013-12-22 ENCOUNTER — Other Ambulatory Visit: Payer: Self-pay

## 2013-12-22 MED ORDER — LISINOPRIL 20 MG PO TABS: ORAL_TABLET | ORAL | Status: DC

## 2013-12-25 ENCOUNTER — Encounter: Payer: Self-pay | Admitting: Family Medicine

## 2013-12-25 ENCOUNTER — Ambulatory Visit (INDEPENDENT_AMBULATORY_CARE_PROVIDER_SITE_OTHER): Payer: Medicare Other | Admitting: Family Medicine

## 2013-12-25 VITALS — BP 160/84 | HR 92 | Temp 98.0°F | Ht 63.0 in | Wt 223.2 lb

## 2013-12-25 DIAGNOSIS — Z01810 Encounter for preprocedural cardiovascular examination: Secondary | ICD-10-CM

## 2013-12-25 DIAGNOSIS — Z23 Encounter for immunization: Secondary | ICD-10-CM

## 2013-12-25 NOTE — Progress Notes (Signed)
Subjective:    Kelly Elliott is a 73 y.o. female who presents to the office today for a preoperative consultation at the request of surgeon Dr Alvan Dame who plans on performing Left Knee UKA (medially)  on December 21. This consultation is requested for the specific conditions prompting preoperative evaluation (i.e. because of potential affect on operative risk): htn, DMII, hyperlipidemia. Planned anesthesia: general. The patient has the following known anesthesia issues: none. Patients bleeding risk: no recent abnormal bleeding. Patient does not have objections to receiving blood products if needed.  The following portions of the patient's history were reviewed and updated as appropriate:  She  has a past medical history of Anxiety; Diabetes mellitus; Hypercholesterolemia; Hypertension; Hypothyroidism; Lumbar pain; and Herniated disc. She  does not have any pertinent problems on file. She  has past surgical history that includes Leg Surgery (05/2010) and Cataract extraction, bilateral. Her family history includes Diabetes in her brother and mother; Heart disease in her father; Hyperlipidemia in her brother and father; Hypertension in her brother and father; Stroke (age of onset: 22) in her father. There is no history of Colon cancer, Rectal cancer, Stomach cancer, or Esophageal cancer. She  reports that she has never smoked. She has never used smokeless tobacco. She reports that she drinks about 4.2 oz of alcohol per week. She reports that she does not use illicit drugs. She has a current medication list which includes the following prescription(s): aspirin, atorvastatin, buspirone, calcium carbonate, cholecalciferol, glipizide, glucose blood, levothyroxine, lisinopril, lorazepam, centrum silver ultra womens, onetouch delica lancets fine, and zolpidem. Current Outpatient Prescriptions on File Prior to Visit  Medication Sig Dispense Refill  . aspirin 81 MG tablet Take 81 mg by mouth daily.      Marland Kitchen  atorvastatin (LIPITOR) 40 MG tablet Take 1 tablet (40 mg total) by mouth daily. 30 tablet 2  . busPIRone (BUSPAR) 30 MG tablet Take 1 tablet (30 mg total) by mouth 2 (two) times daily. 60 tablet 0  . Calcium Carbonate (CALTRATE 600 PO) Take 1 tablet by mouth daily.      . cholecalciferol (VITAMIN D) 1000 UNITS tablet Take 1,000 Units by mouth daily.      Marland Kitchen glipiZIDE (GLUCOTROL XL) 2.5 MG 24 hr tablet Take 1 tablet (2.5 mg total) by mouth daily. 30 tablet 5  . glucose blood (ONETOUCH VERIO) test strip CHECK BLOOD SUGAR ONCE DAILY 100 each 12  . levothyroxine (SYNTHROID, LEVOTHROID) 88 MCG tablet 1 tab by mouth daily. 30 tablet 11  . lisinopril (PRINIVIL,ZESTRIL) 20 MG tablet TAKE TWO (2) TABLETS BY MOUTH EVERY MORNING AND TAKE ONE TABLET BY MOUTH EVERY EVENING--Office visit due now 90 tablet 0  . LORazepam (ATIVAN) 1 MG tablet TAKE ONE TABLET BY MOUTH AS NEEDED 30 tablet 1  . Multiple Vitamins-Minerals (CENTRUM SILVER ULTRA WOMENS) TABS Take 1 tablet by mouth daily.    Glory Rosebush DELICA LANCETS FINE MISC 1 Device by Does not apply route daily. CHECK BLOOD SUGAR ONCE DAILY 100 each 12  . zolpidem (AMBIEN) 5 MG tablet TAKE ONE TABLET BY MOUTH EVERY THIRD NIGHT AT BEDTIME AS NEEDED 30 tablet 0   No current facility-administered medications on file prior to visit.   She is allergic to fluorescein and menthol..  Review of Systems Pertinent items are noted in HPI.    Objective:    BP 160/84 mmHg  Pulse 92  Temp(Src) 98 F (36.7 C) (Oral)  Ht 5\' 3"  (1.6 m)  Wt 223 lb 3.2 oz (  101.243 kg)  BMI 39.55 kg/m2  SpO2 98% General appearance: alert, cooperative, appears stated age and no distress Head: Normocephalic, without obvious abnormality, atraumatic Eyes: conjunctivae/corneas clear. PERRL, EOM's intact. Fundi benign. Ears: normal TM's and external ear canals both ears Nose: Nares normal. Septum midline. Mucosa normal. No drainage or sinus tenderness. Throat: lips, mucosa, and tongue normal;  teeth and gums normal Neck: no adenopathy, no carotid bruit, no JVD, supple, symmetrical, trachea midline and thyroid not enlarged, symmetric, no tenderness/mass/nodules Back: symmetric, no curvature. ROM normal. No CVA tenderness. Lungs: clear to auscultation bilaterally Heart: regular rate and rhythm, S1, S2 normal, no murmur, click, rub or gallop Abdomen: soft, non-tender; bowel sounds normal; no masses,  no organomegaly Extremities: extremities normal, atraumatic, no cyanosis or edema Skin: Skin color, texture, turgor normal. No rashes or lesions Lymph nodes: Cervical, supraclavicular, and axillary nodes normal. Neurologic: Alert and oriented X 3, normal strength and tone. Normal symmetric reflexes. Normal coordination and gait  Predictors of intubation difficulty:  Morbid obesity? yes - BMI 38  Anatomically abnormal facies? no  Prominent incisors? no  Receding mandible? no  Short, thick neck? no  Neck range of motion: normal  Dentition: No chipped, loose, or missing teeth.  Cardiographics ECG: no change since previous ECG dated 04/2012 Echocardiogram: not done  Imaging Chest x-ray: not done   Lab Review  Lab Results  Component Value Date   NA 131* 06/09/2013   NA 129* 03/06/2013   NA 134* 07/19/2012   K 4.3 06/09/2013   K 4.4 03/06/2013   K 4.1 07/19/2012   CO2 27 06/09/2013   CO2 29 03/06/2013   CO2 25 07/19/2012   BUN 19 06/09/2013   BUN 15 03/06/2013   BUN 16 07/19/2012   CREATININE 0.7 06/09/2013   CREATININE 0.8 03/06/2013   CREATININE 0.8 07/19/2012   GLUCOSE 119* 06/09/2013   GLUCOSE 131* 03/06/2013   GLUCOSE 97 07/19/2012   CALCIUM 8.9 06/09/2013   CALCIUM 9.0 03/06/2013   CALCIUM 9.1 07/19/2012    Lab Results  Component Value Date   WBC 8.7 07/19/2012   HGB 12.9 07/19/2012   HCT 37.7 07/19/2012   MCV 97.0 07/19/2012   PLT 159.0 07/19/2012   Lab Results  Component Value Date   HGBA1C 5.7 06/09/2013     Assessment:      73 y.o. female with  planned surgery as above.   Known risk factors for perioperative complications: Diabetes mellitus   Difficulty with intubation is not anticipated.  Cardiac Risk Estimation: low     Plan:    1. Preoperative workup as follows ECG, per surgical team 2. Change in medication regimen before surgery: per surgical team 3. Prophylaxis for cardiac events with perioperative beta-blockers: not indicated. 4. Invasive hemodynamic monitoring perioperatively: at the discretion of anesthesiologist. 5. Deep vein thrombosis prophylaxis postoperatively:regimen to be chosen by surgical team. 6. Surveillance for postoperative MI with ECG immediately postoperatively and on postoperative days 1 and 2 AND troponin levels 24 hours postoperatively and on day 4 or hospital discharge (whichever comes first): at the discretion of anesthesiologist. 7. Other measures: consult triad hospitalist if needed

## 2013-12-25 NOTE — Progress Notes (Signed)
Pre visit review using our clinic review tool, if applicable. No additional management support is needed unless otherwise documented below in the visit note. 

## 2014-01-16 ENCOUNTER — Other Ambulatory Visit: Payer: Self-pay

## 2014-01-16 MED ORDER — BUSPIRONE HCL 30 MG PO TABS: 30.0000 mg | ORAL_TABLET | Freq: Two times a day (BID) | ORAL | Status: DC

## 2014-01-17 NOTE — H&P (Signed)
UNICOMPARTMENTAL KNEE ADMISSION H&P  Patient is being admitted for left medial unicompartmental knee arthroplasty.  Subjective:  Chief Complaint:    Left knee medial compartment primary OA / pain.  HPI: Kelly Elliott, 73 y.o. female, has a history of pain and functional disability in the left knee due to arthritis and has failed non-surgical conservative treatments for greater than 12 weeks to include NSAID's and/or analgesics, use of assistive devices and activity modification.  Onset of symptoms was gradual, starting ~9 years ago with gradually worsening course since that time. The patient noted no past surgery on the left knee(s).  Patient currently rates pain in the left knee(s) at 7 out of 10 with activity. Patient has worsening of pain with activity and weight bearing, pain that interferes with activities of daily living, pain with passive range of motion, crepitus and joint swelling.  Patient has evidence of periarticular osteophytes and joint space narrowing by imaging studies.  There is no active infection.  Risks, benefits and expectations were discussed with the patient.  Risks including but not limited to the risk of anesthesia, blood clots, nerve damage, blood vessel damage, failure of the prosthesis, infection and up to and including death.  Patient understand the risks, benefits and expectations and wishes to proceed with surgery.    PCP: Garnet Koyanagi, DO  D/C Plans:      Home with HHPT  Post-op Meds:       No Rx given   Tranexamic Acid:      To be given - IV    Decadron:      Is not to be given - "severe pain"  FYI:     ASA post-op  Norco post-op  CPAP    Patient Active Problem List   Diagnosis Date Noted  . Type II or unspecified type diabetes mellitus without mention of complication, not stated as uncontrolled 11/17/2010  . Edema 09/26/2010  . Abnormal ECG 05/07/2010  . Preoperative cardiovascular examination 05/07/2010  . Essential hypertension, benign 05/07/2010   . HTN (hypertension) 05/07/2010  . Elevated cholesterol 05/07/2010   Past Medical History  Diagnosis Date  . Anxiety   . Diabetes mellitus   . Hypercholesterolemia   . Hypertension   . Hypothyroidism   . Lumbar pain   . Herniated disc     Past Surgical History  Procedure Laterality Date  . Leg surgery  05/2010    Fibula surgery  . Cataract extraction, bilateral      No prescriptions prior to admission   Allergies  Allergen Reactions  . Corticosteroids Other (See Comments)    Severe pain  . Fluorescein   . Menthol     History  Substance Use Topics  . Smoking status: Never Smoker   . Smokeless tobacco: Never Used  . Alcohol Use: 4.2 oz/week    7 Glasses of wine per week    Family History  Problem Relation Age of Onset  . Diabetes Mother   . Hyperlipidemia Father   . Hypertension Father   . Stroke Father 53    quadruple bypass  . Heart disease Father     quadruple bypass  . Diabetes Brother   . Hypertension Brother   . Hyperlipidemia Brother   . Colon cancer Neg Hx   . Rectal cancer Neg Hx   . Stomach cancer Neg Hx   . Esophageal cancer Neg Hx      Review of Systems  Constitutional: Negative.   HENT: Negative.   Eyes:  Negative.   Respiratory: Negative.   Cardiovascular: Negative.   Gastrointestinal: Positive for constipation.  Genitourinary: Negative.   Musculoskeletal: Positive for back pain and joint pain.  Skin: Negative.   Neurological: Negative.   Endo/Heme/Allergies: Negative.   Psychiatric/Behavioral: The patient is nervous/anxious.     Objective:  Physical Exam  Constitutional: She is oriented to person, place, and time. She appears well-developed and well-nourished.  HENT:  Head: Normocephalic and atraumatic.  Eyes: Pupils are equal, round, and reactive to light.  Neck: Neck supple. No JVD present. No tracheal deviation present. No thyromegaly present.  Cardiovascular: Normal rate, regular rhythm, normal heart sounds and intact distal  pulses.   Respiratory: Effort normal and breath sounds normal. No stridor. No respiratory distress. She has no wheezes.  GI: Soft. There is no tenderness. There is no guarding.  Musculoskeletal:       Left knee: She exhibits decreased range of motion, swelling and bony tenderness. She exhibits no ecchymosis, no deformity, no laceration and no erythema. Tenderness found.  Lymphadenopathy:    She has no cervical adenopathy.  Neurological: She is alert and oriented to person, place, and time.  Skin: Skin is warm and dry.  Psychiatric: She has a normal mood and affect.      Labs:  Estimated body mass index is 38.17 kg/(m^2) as calculated from the following:   Height as of 04/13/12: 5\' 3"  (1.6 m).   Weight as of 03/06/13: 97.705 kg (215 lb 6.4 oz).   Imaging Review Plain radiographs demonstrate severe degenerative joint disease of the left knee medial compartment. The overall alignment is neutral. The bone quality appears to be good for age and reported activity level.  Assessment/Plan:  End stage arthritis, left knee, medial compartment  The patient history, physical examination, clinical judgment of the provider and imaging studies are consistent with end stage degenerative joint disease of the left knee(s) and medial unicompartmental knee arthroplasty is deemed medically necessary. The treatment options including medical management, injection therapy arthroscopy and arthroplasty were discussed at length. The risks and benefits of total knee arthroplasty were presented and reviewed. The risks due to aseptic loosening, infection, stiffness, patella tracking problems, thromboembolic complications and other imponderables were discussed. The patient acknowledged the explanation, agreed to proceed with the plan and consent was signed. Patient is being admitted for observation treatment for surgery, pain control, PT, OT, prophylactic antibiotics, VTE prophylaxis, progressive ambulation and ADL's and  discharge planning. The patient is planning to be discharged home with home health services.      Kelly Pugh Ola Fawver   PA-C  01/17/2014, 2:30 PM

## 2014-01-19 ENCOUNTER — Other Ambulatory Visit (HOSPITAL_COMMUNITY): Payer: Self-pay | Admitting: *Deleted

## 2014-01-19 NOTE — Patient Instructions (Addendum)
20     Your procedure is scheduled on:  Monday 01/29/2014  Report to [facility] Main Entrance and follow signs to Short Stay  at  215 PM.  Call this number if you have problems the morning of surgery: [phone]                    Remember:          Do not eat food AFTER MIDNIGHT! MAY HAVE CLEAR LIQUIDS FROM MIDNIGHT UP UNTIL 1015 AM THEN NOTHING UNTIL AFTER SURGERY!  Take these medicines the morning of surgery with A SIP OF WATER: Levothyroxine, Buspirone    [facility] IS NOT RESPONSIBLE FOR ANY BELONGINGS OR VALUABLES BROUGHT TO HOSPITAL.  Marland Kitchen  Leave suitcase in the car. After surgery it may be brought to your room.     DO NOT WEAR   JEWELRY,MAKE-UP,LOTIONS,POWDERS,PERFUMES,CONTACTS , DENTURES, BRIDGEWORK AND DO NOT WEAR FALSE EYELASHES  !                                  Patients discharged the day of surgery will not be allowed to drive home. If going home the same day of surgery, must have someone stay with you first 24 hrs.at home and arrange for someone to drive you home from the [place]: N/A   Special Instructions:              Please read over the following fact sheets that you were given:             1. [place] - Preparing for Surgery Before surgery, you can play an important role.  Because skin is not sterile, your skin needs to be as free of germs as possible.  You can reduce the number of germs on your skin by washing with CHG (chlorahexidine gluconate) soap before surgery.  CHG is an antiseptic cleaner which kills germs and bonds with the skin to continue killing germs even after washing. Please DO NOT use if you have an allergy to CHG or antibacterial soaps.  If your skin becomes reddened/irritated stop using the CHG and inform your nurse when you arrive at Short Stay. Do not shave  (including legs and underarms) for at least 48 hours prior to the first CHG shower.  You may shave your face/neck. Please follow these instructions carefully:  1.  Shower with CHG Soap the night before surgery and the  morning of Surgery.  2.  If you choose to wash your hair, wash your hair first as usual with your  normal  shampoo.  3.  After you shampoo, rinse your hair and body thoroughly to remove the  shampoo.  4.  Use CHG as you would any other liquid soap.  You can apply chg directly  to the skin and wash                       Gently with a scrungie or clean washcloth.  5.  Apply the CHG Soap to your body ONLY FROM THE NECK DOWN.   Do not use on face/ open                           Wound or open sores. Avoid contact with eyes, ears mouth and genitals (private parts).                       Wash face,  Genitals (private parts) with your normal soap.             6.  Wash thoroughly, paying special attention to the area where your surgery  will be performed.  7.  Thoroughly rinse your body with warm water from the neck down.  8.  DO NOT shower/wash with your normal soap after using and rinsing off  the CHG Soap.                9.  Pat yourself dry with a clean towel.            10.  Wear clean pajamas.            11.  Place clean sheets on your bed the night of your first shower and do not  sleep with pets. Day of Surgery : Do not apply any lotions/deodorants the morning of surgery.  Please wear clean clothes to the hospital/surgery center.  FAILURE TO FOLLOW THESE INSTRUCTIONS MAY RESULT IN THE CANCELLATION OF YOUR SURGERY PATIENT SIGNATURE_________________________________  NURSE SIGNATURE__________________________________  ________________________________________________________________________   Kelly Elliott  An incentive spirometer is a tool that can help keep your lungs clear and active. This tool measures how well you are filling your lungs with  each breath. Taking long deep breaths may help reverse or decrease the chance of developing breathing (pulmonary) problems (especially infection) following:  A long period of time when you are unable to move or be active. BEFORE THE PROCEDURE   If the spirometer includes an indicator to show your best effort, your nurse or respiratory therapist will set it to a desired goal.  If possible, sit up straight or lean slightly forward. Try not to slouch.  Hold the incentive spirometer in an upright position. INSTRUCTIONS FOR USE   Sit on the edge of your bed if possible, or sit up as far as you can in bed or on a chair.  Hold the incentive spirometer in an upright position.  Breathe out normally.  Place the mouthpiece in your mouth and seal your lips tightly around it.  Breathe in slowly and as deeply as possible, raising the piston or the ball toward the top of the column.  Hold your breath for 3-5 seconds or for as long as possible. Allow the piston or ball to fall to the bottom of the column.  Remove the mouthpiece from your mouth and breathe out normally.  Rest for a few seconds and repeat Steps 1 through 7 at least 10 times every 1-2 hours when you are awake. Take your time and take a few normal breaths between deep breaths.  The spirometer may include an indicator to show  your best effort. Use the indicator as a goal to work toward during each repetition.  After each set of 10 deep breaths, practice coughing to be sure your lungs are clear. If you have an incision (the cut made at the time of surgery), support your incision when coughing by placing a pillow or rolled up towels firmly against it. Once you are able to get out of bed, walk around indoors and cough well. You may stop using the incentive spirometer when instructed by your caregiver.  RISKS AND COMPLICATIONS  Take your time so you do not get dizzy or light-headed.  If you are in pain, you may need to take or ask for  pain medication before doing incentive spirometry. It is harder to take a deep breath if you are having pain. AFTER USE  Rest and breathe slowly and easily.  It can be helpful to keep track of a log of your progress. Your caregiver can provide you with a simple table to help with this. If you are using the spirometer at home, follow these instructions: [place] IF:   You are having difficultly using the spirometer.  You have trouble using the spirometer as often as instructed.  Your pain medication is not giving enough relief while using the spirometer.  You develop fever of 100.5 F (38.1 C) or higher. SEEK IMMEDIATE MEDICAL CARE IF:   You cough up bloody sputum that had not been present before.  You develop fever of 102 F (38.9 C) or greater.  You develop worsening pain at or near the incision site. MAKE SURE YOU:   Understand these instructions.  Will watch your condition.  Will get help right away if you are not doing well or get worse. Document Released: 06/08/2006 Document Revised: 04/20/2011 Document Reviewed: 08/09/2006 ExitCare Patient Information 2014 ExitCare, [place].   ________________________________________________________________________  WHAT IS A BLOOD TRANSFUSION? Blood Transfusion Information  A transfusion is the replacement of blood or some of its parts. Blood is made up of multiple cells which provide different functions.  Red blood cells carry oxygen and are used for blood loss replacement.  White blood cells fight against infection.  Platelets control bleeding.  Plasma helps clot blood.  Other blood products are available for specialized needs, such as hemophilia or other clotting disorders. BEFORE THE TRANSFUSION  Who gives blood for transfusions?   Healthy volunteers who are fully evaluated to make sure their blood is safe. This is blood bank blood. Transfusion therapy is the safest it has ever been in the practice of medicine.  Before blood is taken from a donor, a complete history is taken to make sure that person has no history of diseases nor engages in risky social behavior (examples are intravenous drug use or sexual activity with multiple partners). The donor's travel history is screened to minimize risk of transmitting infections, such as malaria. The donated blood is tested for signs of infectious diseases, such as HIV and hepatitis. The blood is then tested to be sure it is compatible with you in order to minimize the chance of a transfusion reaction. If you or a relative donates blood, this is often done in anticipation of surgery and is not appropriate for emergency situations. It takes many days to process the donated blood. RISKS AND COMPLICATIONS Although transfusion therapy is very safe and saves many lives, the main dangers of transfusion include:   Getting an infectious disease.  Developing a transfusion reaction. This is an allergic reaction to  something in the blood you were given. Every precaution is taken to prevent this. The decision to have a blood transfusion has been considered carefully by your caregiver before blood is given. Blood is not given unless the benefits outweigh the risks. AFTER THE TRANSFUSION  Right after receiving a blood transfusion, you will usually feel much better and more energetic. This is especially true if your red blood cells have gotten low (anemic). The transfusion raises the level of the red blood cells which carry oxygen, and this usually causes an energy increase.  The nurse administering the transfusion will monitor you carefully for complications. HOME CARE INSTRUCTIONS  No special instructions are needed after a transfusion. You may find your energy is better. Speak with your caregiver about any limitations on activity for underlying diseases you may have. SEEK MEDICAL CARE IF:   Your condition is not improving after your transfusion.  You develop redness or  irritation at the intravenous (IV) site. SEEK IMMEDIATE MEDICAL CARE IF:  Any of the following symptoms occur over the next 12 hours:  Shaking chills.  You have a temperature by mouth above 102 F (38.9 C), not controlled by medicine.  Chest, back, or muscle pain.  People around you feel you are not acting correctly or are confused.  Shortness of breath or difficulty breathing.  Dizziness and fainting.  You get a rash or develop hives.  You have a decrease in urine output.  Your urine turns a dark color or changes to pink, red, or brown. Any of the following symptoms occur over the next 10 days:  You have a temperature by mouth above 102 F (38.9 C), not controlled by medicine.  Shortness of breath.  Weakness after normal activity.  The white part of the eye turns yellow (jaundice).  You have a decrease in the amount of urine or are urinating less often.  Your urine turns a dark color or changes to pink, red, or brown. Document Released: 01/24/2000 Document Revised: 04/20/2011 Document Reviewed: 09/12/2007 ExitCare Patient Information 2014 ExitCare, [place].  _______________________________________________________________________   CLEAR LIQUID DIET   Foods Allowed                                                                     Foods Excluded  Coffee and tea, regular and decaf                             liquids that you cannot  Plain Jell-O in any flavor                                             see through such as: Fruit ices (not with fruit pulp)                                     milk, soups, orange juice  Iced Popsicles  All solid food Carbonated beverages, regular and diet                                    Cranberry, grape and apple juices Sports drinks like Gatorade Lightly seasoned clear broth or consume(fat free) Sugar, honey syrup  Sample Menu Breakfast                                Lunch                                      Supper Cranberry juice                    Beef broth                            Chicken broth Jell-O                                     Grape juice                           Apple juice Coffee or tea                        Jell-O                                      Popsicle                                                Coffee or tea                        Coffee or tea  _____________________________________________________________________

## 2014-01-22 ENCOUNTER — Ambulatory Visit (HOSPITAL_COMMUNITY)
Admission: RE | Admit: 2014-01-22 | Discharge: 2014-01-22 | Disposition: A | Discharge status: Home / Self Care | Payer: Medicare Other | Source: Ambulatory Visit | Attending: Anesthesiology | Admitting: Anesthesiology

## 2014-01-22 ENCOUNTER — Encounter (HOSPITAL_COMMUNITY)
Admission: RE | Admit: 2014-01-22 | Discharge: 2014-01-22 | Disposition: A | Discharge status: Home / Self Care | Payer: Medicare Other | Source: Ambulatory Visit | Attending: Orthopedic Surgery | Admitting: Orthopedic Surgery

## 2014-01-22 ENCOUNTER — Encounter (HOSPITAL_COMMUNITY): Payer: Self-pay

## 2014-01-22 DIAGNOSIS — M1712 Unilateral primary osteoarthritis, left knee: Secondary | ICD-10-CM | POA: Insufficient documentation

## 2014-01-22 DIAGNOSIS — E119 Type 2 diabetes mellitus without complications: Secondary | ICD-10-CM | POA: Insufficient documentation

## 2014-01-22 DIAGNOSIS — Z7982 Long term (current) use of aspirin: Secondary | ICD-10-CM | POA: Diagnosis not present

## 2014-01-22 DIAGNOSIS — I10 Essential (primary) hypertension: Secondary | ICD-10-CM

## 2014-01-22 DIAGNOSIS — Z01818 Encounter for other preprocedural examination: Secondary | ICD-10-CM | POA: Diagnosis not present

## 2014-01-22 DIAGNOSIS — Z79899 Other long term (current) drug therapy: Secondary | ICD-10-CM | POA: Insufficient documentation

## 2014-01-22 DIAGNOSIS — Z01812 Encounter for preprocedural laboratory examination: Secondary | ICD-10-CM | POA: Diagnosis present

## 2014-01-22 HISTORY — DX: Unspecified hemorrhoids: K64.9

## 2014-01-22 HISTORY — DX: Unspecified osteoarthritis, unspecified site: M19.90

## 2014-01-22 LAB — SURGICAL PCR SCREEN
MRSA, PCR: NEGATIVE
Staphylococcus aureus: NEGATIVE

## 2014-01-22 LAB — URINALYSIS, ROUTINE W REFLEX MICROSCOPIC
Bilirubin Urine: NEGATIVE
GLUCOSE, UA: 100 mg/dL — AB
Hgb urine dipstick: NEGATIVE
KETONES UR: NEGATIVE mg/dL
Leukocytes, UA: NEGATIVE
Nitrite: NEGATIVE
Protein, ur: NEGATIVE mg/dL
Specific Gravity, Urine: 1.011 (ref 1.005–1.030)
Urobilinogen, UA: 0.2 mg/dL (ref 0.0–1.0)
pH: 5 (ref 5.0–8.0)

## 2014-01-22 LAB — PROTIME-INR
INR: 0.93 (ref 0.00–1.49)
Prothrombin Time: 12.6 seconds (ref 11.6–15.2)

## 2014-01-22 LAB — BASIC METABOLIC PANEL
Anion gap: 14 (ref 5–15)
BUN: 21 mg/dL (ref 6–23)
CALCIUM: 9.2 mg/dL (ref 8.4–10.5)
CO2: 26 mEq/L (ref 19–32)
Chloride: 94 mEq/L — ABNORMAL LOW (ref 96–112)
Creatinine, Ser: 0.8 mg/dL (ref 0.50–1.10)
GFR calc Af Amer: 84 mL/min — ABNORMAL LOW (ref >90)
GFR calc non Af Amer: 72 mL/min — ABNORMAL LOW (ref >90)
GLUCOSE: 157 mg/dL — AB (ref 70–99)
Potassium: 5.2 mEq/L (ref 3.7–5.3)
SODIUM: 134 meq/L — AB (ref 137–147)

## 2014-01-22 LAB — APTT: aPTT: 27 seconds (ref 24–37)

## 2014-01-22 LAB — CBC
HEMATOCRIT: 38.4 % (ref 36.0–46.0)
HEMOGLOBIN: 12.7 g/dL (ref 12.0–15.0)
MCH: 32.2 pg (ref 26.0–34.0)
MCHC: 33.1 g/dL (ref 30.0–36.0)
MCV: 97.2 fL (ref 78.0–100.0)
Platelets: 197 10*3/uL (ref 150–400)
RBC: 3.95 MIL/uL (ref 3.87–5.11)
RDW: 12.4 % (ref 11.5–15.5)
WBC: 9.2 10*3/uL (ref 4.0–10.5)

## 2014-01-22 LAB — ABO/RH: ABO/RH(D): A POS

## 2014-01-25 ENCOUNTER — Telehealth: Payer: Self-pay | Admitting: Family Medicine

## 2014-01-25 MED ORDER — LISINOPRIL 20 MG PO TABS: ORAL_TABLET | ORAL | Status: DC

## 2014-01-25 NOTE — Telephone Encounter (Signed)
Caller name:Baune, Deanne Relation to W[place]:BRKV Call back number:[phone] Pharmacy: [facility]  Reason for call: pt states pharmacy has sent request twice for rx lisinopril (PRINIVIL,ZESTRIL) 20 but has not gotten a response, pt states she will be out of meds this week and need the meds before her surgery next week.

## 2014-01-26 NOTE — Progress Notes (Signed)
Patient aware to arrive at 1250pm.  Surgery time now 1550-1720pm.  May have clear liquids until 0900am.  Then nothing by mouth. Patient voices understanding.

## 2014-01-29 ENCOUNTER — Encounter (HOSPITAL_COMMUNITY)
Admission: RE | Disposition: A | Discharge status: Home / Self Care | Payer: Self-pay | Source: Ambulatory Visit | Attending: Orthopedic Surgery

## 2014-01-29 ENCOUNTER — Ambulatory Visit (HOSPITAL_COMMUNITY): Payer: Medicare Other | Admitting: Anesthesiology

## 2014-01-29 ENCOUNTER — Inpatient Hospital Stay (HOSPITAL_COMMUNITY)
Admission: RE | Admit: 2014-01-29 | Discharge: 2014-01-31 | DRG: 470 | Disposition: A | Discharge status: Home / Self Care | Payer: Medicare Other | Source: Ambulatory Visit | Attending: Orthopedic Surgery | Admitting: Orthopedic Surgery

## 2014-01-29 ENCOUNTER — Encounter (HOSPITAL_COMMUNITY): Payer: Self-pay | Admitting: General Practice

## 2014-01-29 DIAGNOSIS — Z96652 Presence of left artificial knee joint: Secondary | ICD-10-CM

## 2014-01-29 DIAGNOSIS — Z888 Allergy status to other drugs, medicaments and biological substances status: Secondary | ICD-10-CM | POA: Diagnosis not present

## 2014-01-29 DIAGNOSIS — E039 Hypothyroidism, unspecified: Secondary | ICD-10-CM | POA: Diagnosis present

## 2014-01-29 DIAGNOSIS — M1712 Unilateral primary osteoarthritis, left knee: Principal | ICD-10-CM | POA: Diagnosis present

## 2014-01-29 DIAGNOSIS — E780 Pure hypercholesterolemia: Secondary | ICD-10-CM | POA: Diagnosis present

## 2014-01-29 DIAGNOSIS — F419 Anxiety disorder, unspecified: Secondary | ICD-10-CM | POA: Diagnosis present

## 2014-01-29 DIAGNOSIS — I10 Essential (primary) hypertension: Secondary | ICD-10-CM | POA: Diagnosis present

## 2014-01-29 DIAGNOSIS — M25562 Pain in left knee: Secondary | ICD-10-CM | POA: Diagnosis present

## 2014-01-29 DIAGNOSIS — E119 Type 2 diabetes mellitus without complications: Secondary | ICD-10-CM | POA: Diagnosis present

## 2014-01-29 HISTORY — PX: PARTIAL KNEE ARTHROPLASTY: SHX2174

## 2014-01-29 LAB — GLUCOSE, CAPILLARY
GLUCOSE-CAPILLARY: 76 mg/dL (ref 70–99)
GLUCOSE-CAPILLARY: 152 mg/dL — AB (ref 70–99)
Glucose-Capillary: 130 mg/dL — ABNORMAL HIGH (ref 70–99)
Glucose-Capillary: 119 mg/dL — ABNORMAL HIGH (ref 70–99)
Glucose-Capillary: 69 mg/dL — ABNORMAL LOW (ref 70–99)

## 2014-01-29 LAB — TYPE AND SCREEN
ABO/RH(D): A POS
ANTIBODY SCREEN: NEGATIVE

## 2014-01-29 SURGERY — ARTHROPLASTY, KNEE, UNICOMPARTMENTAL
Anesthesia: Spinal | Site: Knee | Laterality: Left

## 2014-01-29 MED ORDER — LEVOTHYROXINE SODIUM 88 MCG PO TABS
88.0000 ug | ORAL_TABLET | Freq: Every day | ORAL | Status: DC
  Administered 2014-01-30 – 2014-01-31 (×2): 88 ug via ORAL
  Filled 2014-01-29 (×4): qty 1

## 2014-01-29 MED ORDER — ONDANSETRON HCL 4 MG/2ML IJ SOLN
INTRAMUSCULAR | Status: AC
  Filled 2014-01-29: qty 2

## 2014-01-29 MED ORDER — POLYETHYLENE GLYCOL 3350 17 G PO PACK
17.0000 g | PACK | Freq: Two times a day (BID) | ORAL | Status: DC
  Administered 2014-01-29 – 2014-01-31 (×3): 17 g via ORAL

## 2014-01-29 MED ORDER — HYDROMORPHONE HCL 1 MG/ML IJ SOLN
0.5000 mg | INTRAMUSCULAR | Status: DC | PRN
  Administered 2014-01-29 (×2): 1 mg via INTRAVENOUS
  Filled 2014-01-29 (×2): qty 1

## 2014-01-29 MED ORDER — METHOCARBAMOL 1000 MG/10ML IJ SOLN
500.0000 mg | Freq: Four times a day (QID) | INTRAVENOUS | Status: DC | PRN
  Filled 2014-01-29: qty 5

## 2014-01-29 MED ORDER — METHOCARBAMOL 500 MG PO TABS
500.0000 mg | ORAL_TABLET | Freq: Four times a day (QID) | ORAL | Status: DC | PRN
  Administered 2014-01-30 – 2014-01-31 (×3): 500 mg via ORAL
  Filled 2014-01-29 (×3): qty 1

## 2014-01-29 MED ORDER — METOCLOPRAMIDE HCL 10 MG PO TABS: 5.0000 mg | ORAL_TABLET | Freq: Three times a day (TID) | ORAL | Status: DC | PRN

## 2014-01-29 MED ORDER — HYDROMORPHONE HCL 1 MG/ML IJ SOLN: 0.2500 mg | INTRAMUSCULAR | Status: DC | PRN

## 2014-01-29 MED ORDER — METOCLOPRAMIDE HCL 5 MG/ML IJ SOLN
5.0000 mg | Freq: Three times a day (TID) | INTRAMUSCULAR | Status: DC | PRN
  Administered 2014-01-30: 10 mg via INTRAVENOUS
  Filled 2014-01-29: qty 2

## 2014-01-29 MED ORDER — ATORVASTATIN CALCIUM 40 MG PO TABS
40.0000 mg | ORAL_TABLET | Freq: Every day | ORAL | Status: DC
  Administered 2014-01-29 – 2014-01-30 (×2): 40 mg via ORAL
  Filled 2014-01-29 (×4): qty 1

## 2014-01-29 MED ORDER — ASPIRIN EC 325 MG PO TBEC
325.0000 mg | DELAYED_RELEASE_TABLET | Freq: Two times a day (BID) | ORAL | Status: DC
  Administered 2014-01-30 – 2014-01-31 (×3): 325 mg via ORAL
  Filled 2014-01-29 (×5): qty 1

## 2014-01-29 MED ORDER — MAGNESIUM CITRATE PO SOLN: 1.0000 | Freq: Once | ORAL | Status: AC | PRN

## 2014-01-29 MED ORDER — BUPIVACAINE-EPINEPHRINE (PF) 0.25% -1:200000 IJ SOLN
INTRAMUSCULAR | Status: AC
  Filled 2014-01-29: qty 30

## 2014-01-29 MED ORDER — POTASSIUM CHLORIDE 2 MEQ/ML IV SOLN
INTRAVENOUS | Status: DC
  Administered 2014-01-30: 01:00:00 via INTRAVENOUS
  Filled 2014-01-29 (×10): qty 1000

## 2014-01-29 MED ORDER — HYDROCODONE-ACETAMINOPHEN 7.5-325 MG PO TABS
1.0000 | ORAL_TABLET | ORAL | Status: DC
  Administered 2014-01-29 – 2014-01-30 (×3): 1 via ORAL
  Administered 2014-01-30 (×3): 2 via ORAL
  Administered 2014-01-30 (×2): 1 via ORAL
  Administered 2014-01-31: 2 via ORAL
  Filled 2014-01-29 (×3): qty 1
  Filled 2014-01-29 (×2): qty 2
  Filled 2014-01-29: qty 1
  Filled 2014-01-29 (×2): qty 2
  Filled 2014-01-29: qty 1

## 2014-01-29 MED ORDER — PROPOFOL 10 MG/ML IV BOLUS
INTRAVENOUS | Status: AC
  Filled 2014-01-29: qty 20

## 2014-01-29 MED ORDER — PHENYLEPHRINE HCL 10 MG/ML IJ SOLN
INTRAMUSCULAR | Status: DC | PRN
  Administered 2014-01-29 (×2): 80 ug via INTRAVENOUS
  Administered 2014-01-29: 40 ug via INTRAVENOUS
  Administered 2014-01-29: 80 ug via INTRAVENOUS

## 2014-01-29 MED ORDER — CEFAZOLIN SODIUM-DEXTROSE 2-3 GM-% IV SOLR
INTRAVENOUS | Status: AC
Start: 2014-01-29 — End: 2014-01-29
  Filled 2014-01-29: qty 50

## 2014-01-29 MED ORDER — BUPIVACAINE IN DEXTROSE 0.75-8.25 % IT SOLN
INTRATHECAL | Status: DC | PRN
  Administered 2014-01-29: 1.8 mL via INTRATHECAL

## 2014-01-29 MED ORDER — PROPOFOL INFUSION 10 MG/ML OPTIME
INTRAVENOUS | Status: DC | PRN
  Administered 2014-01-29: 100 ug/kg/min via INTRAVENOUS

## 2014-01-29 MED ORDER — PHENYLEPHRINE HCL 10 MG/ML IJ SOLN
10.0000 mg | INTRAMUSCULAR | Status: DC | PRN
  Administered 2014-01-29: 50 ug/min via INTRAVENOUS

## 2014-01-29 MED ORDER — DEXTROSE 50 % IV SOLN
INTRAVENOUS | Status: AC
  Filled 2014-01-29: qty 50

## 2014-01-29 MED ORDER — SODIUM CHLORIDE 0.9 % IV BOLUS (SEPSIS): 500.0000 mL | Freq: Once | INTRAVENOUS | Status: DC

## 2014-01-29 MED ORDER — BUSPIRONE HCL 15 MG PO TABS
30.0000 mg | ORAL_TABLET | Freq: Two times a day (BID) | ORAL | Status: DC
  Administered 2014-01-29 – 2014-01-31 (×4): 30 mg via ORAL
  Filled 2014-01-29 (×6): qty 2

## 2014-01-29 MED ORDER — PHENYLEPHRINE 40 MCG/ML (10ML) SYRINGE FOR IV PUSH (FOR BLOOD PRESSURE SUPPORT)
PREFILLED_SYRINGE | INTRAVENOUS | Status: AC
  Filled 2014-01-29: qty 10

## 2014-01-29 MED ORDER — SODIUM CHLORIDE 0.9 % IJ SOLN
INTRAMUSCULAR | Status: AC
  Filled 2014-01-29: qty 50

## 2014-01-29 MED ORDER — ALUM & MAG HYDROXIDE-SIMETH 200-200-20 MG/5ML PO SUSP: 30.0000 mL | ORAL | Status: DC | PRN

## 2014-01-29 MED ORDER — INSULIN ASPART 100 UNIT/ML ~~LOC~~ SOLN: 0.0000 [IU] | Freq: Three times a day (TID) | SUBCUTANEOUS | Status: DC

## 2014-01-29 MED ORDER — DEXTROSE 50 % IV SOLN
25.0000 mL | Freq: Once | INTRAVENOUS | Status: AC
  Administered 2014-01-29: 25 mL via INTRAVENOUS

## 2014-01-29 MED ORDER — ZOLPIDEM TARTRATE 5 MG PO TABS
5.0000 mg | ORAL_TABLET | Freq: Every evening | ORAL | Status: DC | PRN
  Administered 2014-01-29: 5 mg via ORAL
  Filled 2014-01-29: qty 1

## 2014-01-29 MED ORDER — CEFAZOLIN SODIUM-DEXTROSE 2-3 GM-% IV SOLR
2.0000 g | INTRAVENOUS | Status: AC
  Administered 2014-01-29: 2 g via INTRAVENOUS

## 2014-01-29 MED ORDER — LACTATED RINGERS IV SOLN
Freq: Once | INTRAVENOUS | Status: AC
  Administered 2014-01-29: 500 mL via INTRAVENOUS

## 2014-01-29 MED ORDER — LACTATED RINGERS IV SOLN
INTRAVENOUS | Status: DC
  Administered 2014-01-29: 16:00:00 via INTRAVENOUS
  Administered 2014-01-29: 1000 mL via INTRAVENOUS

## 2014-01-29 MED ORDER — FERROUS SULFATE 325 (65 FE) MG PO TABS
325.0000 mg | ORAL_TABLET | Freq: Three times a day (TID) | ORAL | Status: DC
  Administered 2014-01-30: 325 mg via ORAL
  Filled 2014-01-29 (×8): qty 1

## 2014-01-29 MED ORDER — ONDANSETRON HCL 4 MG PO TABS: 4.0000 mg | ORAL_TABLET | Freq: Four times a day (QID) | ORAL | Status: DC | PRN

## 2014-01-29 MED ORDER — BUPIVACAINE-EPINEPHRINE 0.25% -1:200000 IJ SOLN
INTRAMUSCULAR | Status: DC | PRN
  Administered 2014-01-29: 30 mL

## 2014-01-29 MED ORDER — CELECOXIB 200 MG PO CAPS
200.0000 mg | ORAL_CAPSULE | Freq: Two times a day (BID) | ORAL | Status: DC
  Administered 2014-01-29 – 2014-01-31 (×4): 200 mg via ORAL
  Filled 2014-01-29 (×6): qty 1

## 2014-01-29 MED ORDER — DOCUSATE SODIUM 100 MG PO CAPS
100.0000 mg | ORAL_CAPSULE | Freq: Two times a day (BID) | ORAL | Status: DC
  Administered 2014-01-29 – 2014-01-31 (×4): 100 mg via ORAL

## 2014-01-29 MED ORDER — DIPHENHYDRAMINE HCL 25 MG PO CAPS: 25.0000 mg | ORAL_CAPSULE | Freq: Four times a day (QID) | ORAL | Status: DC | PRN

## 2014-01-29 MED ORDER — PROMETHAZINE HCL 25 MG/ML IJ SOLN: 6.2500 mg | INTRAMUSCULAR | Status: DC | PRN

## 2014-01-29 MED ORDER — TRANEXAMIC ACID 100 MG/ML IV SOLN
1000.0000 mg | Freq: Once | INTRAVENOUS | Status: AC
  Administered 2014-01-29: 1000 mg via INTRAVENOUS
  Filled 2014-01-29: qty 10

## 2014-01-29 MED ORDER — KETOROLAC TROMETHAMINE 30 MG/ML IJ SOLN
INTRAMUSCULAR | Status: DC | PRN
  Administered 2014-01-29: 30 mg via INTRAVENOUS

## 2014-01-29 MED ORDER — ALPRAZOLAM ER 0.5 MG PO TB24
1.0000 mg | ORAL_TABLET | ORAL | Status: DC | PRN
  Administered 2014-01-30: 1 mg via ORAL
  Filled 2014-01-29: qty 2

## 2014-01-29 MED ORDER — PHENYLEPHRINE HCL 10 MG/ML IJ SOLN
INTRAMUSCULAR | Status: AC
  Filled 2014-01-29: qty 1

## 2014-01-29 MED ORDER — SODIUM CHLORIDE 0.9 % IJ SOLN
INTRAMUSCULAR | Status: DC | PRN
  Administered 2014-01-29: 30 mL via INTRAVENOUS

## 2014-01-29 MED ORDER — PROMETHAZINE HCL 25 MG/ML IJ SOLN
6.2500 mg | INTRAMUSCULAR | Status: DC | PRN
Start: 2014-01-29 — End: 2014-01-29

## 2014-01-29 MED ORDER — LACTATED RINGERS IV SOLN
INTRAVENOUS | Status: DC
  Administered 2014-01-29: 18:00:00 via INTRAVENOUS

## 2014-01-29 MED ORDER — CEFAZOLIN SODIUM-DEXTROSE 2-3 GM-% IV SOLR
2.0000 g | Freq: Four times a day (QID) | INTRAVENOUS | Status: AC
  Administered 2014-01-29 – 2014-01-30 (×2): 2 g via INTRAVENOUS
  Filled 2014-01-29 (×2): qty 50

## 2014-01-29 MED ORDER — GLIPIZIDE ER 2.5 MG PO TB24
2.5000 mg | ORAL_TABLET | Freq: Every morning | ORAL | Status: DC
Start: 2014-01-30 — End: 2014-01-31
  Administered 2014-01-30 – 2014-01-31 (×2): 2.5 mg via ORAL
  Filled 2014-01-29 (×2): qty 1

## 2014-01-29 MED ORDER — BISACODYL 10 MG RE SUPP: 10.0000 mg | Freq: Every day | RECTAL | Status: DC | PRN

## 2014-01-29 MED ORDER — MIDAZOLAM HCL 5 MG/5ML IJ SOLN
INTRAMUSCULAR | Status: DC | PRN
  Administered 2014-01-29 (×2): 2 mg via INTRAVENOUS

## 2014-01-29 MED ORDER — LIDOCAINE HCL (CARDIAC) 20 MG/ML IV SOLN
INTRAVENOUS | Status: DC | PRN
  Administered 2014-01-29: 50 mg via INTRAVENOUS

## 2014-01-29 MED ORDER — MIDAZOLAM HCL 2 MG/2ML IJ SOLN
INTRAMUSCULAR | Status: AC
  Filled 2014-01-29: qty 2

## 2014-01-29 MED ORDER — FENTANYL CITRATE 0.05 MG/ML IJ SOLN
INTRAMUSCULAR | Status: DC | PRN
  Administered 2014-01-29: 100 ug via INTRAVENOUS

## 2014-01-29 MED ORDER — KETOROLAC TROMETHAMINE 30 MG/ML IJ SOLN
INTRAMUSCULAR | Status: AC
  Filled 2014-01-29: qty 1

## 2014-01-29 MED ORDER — FENTANYL CITRATE 0.05 MG/ML IJ SOLN
INTRAMUSCULAR | Status: AC
  Filled 2014-01-29: qty 2

## 2014-01-29 MED ORDER — ONDANSETRON HCL 4 MG/2ML IJ SOLN
INTRAMUSCULAR | Status: DC | PRN
  Administered 2014-01-29: 4 mg via INTRAVENOUS

## 2014-01-29 MED ORDER — ONDANSETRON HCL 4 MG/2ML IJ SOLN
4.0000 mg | Freq: Four times a day (QID) | INTRAMUSCULAR | Status: DC | PRN
  Administered 2014-01-30: 4 mg via INTRAVENOUS
  Filled 2014-01-29: qty 2

## 2014-01-29 MED ORDER — TRIAMCINOLONE ACETONIDE 0.1 % EX OINT
1.0000 "application " | TOPICAL_OINTMENT | Freq: Every day | CUTANEOUS | Status: DC | PRN
  Filled 2014-01-29: qty 15

## 2014-01-29 SURGICAL SUPPLY — 48 items
BAG ZIPLOCK 12X15 (MISCELLANEOUS) IMPLANT
BANDAGE ELASTIC 6 VELCRO ST LF (GAUZE/BANDAGES/DRESSINGS) ×3 IMPLANT
BANDAGE ESMARK 6X9 LF (GAUZE/BANDAGES/DRESSINGS) ×1 IMPLANT
BLADE SAW RECIPROCATING 77.5 (BLADE) ×3 IMPLANT
BLADE SAW SGTL 13.0X1.19X90.0M (BLADE) ×3 IMPLANT
BNDG ESMARK 6X9 LF (GAUZE/BANDAGES/DRESSINGS) ×3
BOWL SMART MIX CTS (DISPOSABLE) ×3 IMPLANT
CAPT KNEE PARTIAL 2 ×3 IMPLANT
CEMENT HV SMART SET (Cement) ×3 IMPLANT
CUFF TOURN SGL QUICK 34 (TOURNIQUET CUFF) ×2
CUFF TRNQT CYL 34X4X40X1 (TOURNIQUET CUFF) ×1 IMPLANT
DRAPE EXTREMITY T 121X128X90 (DRAPE) ×3 IMPLANT
DRAPE POUCH INSTRU U-SHP 10X18 (DRAPES) ×3 IMPLANT
DRAPE U-SHAPE 47X51 STRL (DRAPES) ×3 IMPLANT
DRSG AQUACEL AG ADV 3.5X10 (GAUZE/BANDAGES/DRESSINGS) ×3 IMPLANT
DRSG TEGADERM 4X4.75 (GAUZE/BANDAGES/DRESSINGS) IMPLANT
DURAPREP 26ML APPLICATOR (WOUND CARE) ×3 IMPLANT
ELECT REM PT RETURN 9FT ADLT (ELECTROSURGICAL) ×3
ELECTRODE REM PT RTRN 9FT ADLT (ELECTROSURGICAL) ×1 IMPLANT
EVACUATOR 1/8 PVC DRAIN (DRAIN) IMPLANT
FACESHIELD WRAPAROUND (MASK) ×12 IMPLANT
GAUZE SPONGE 2X2 8PLY STRL LF (GAUZE/BANDAGES/DRESSINGS) IMPLANT
GLOVE BIOGEL PI IND STRL 7.5 (GLOVE) ×1 IMPLANT
GLOVE BIOGEL PI IND STRL 8.5 (GLOVE) ×1 IMPLANT
GLOVE BIOGEL PI INDICATOR 7.5 (GLOVE) ×2
GLOVE BIOGEL PI INDICATOR 8.5 (GLOVE) ×2
GLOVE ECLIPSE 8.0 STRL XLNG CF (GLOVE) ×3 IMPLANT
GLOVE ORTHO TXT STRL SZ7.5 (GLOVE) ×6 IMPLANT
GOWN SPEC L3 XXLG W/TWL (GOWN DISPOSABLE) ×6 IMPLANT
GOWN STRL REUS W/TWL XL LVL3 (GOWN DISPOSABLE) ×9 IMPLANT
KIT BASIN OR (CUSTOM PROCEDURE TRAY) ×3 IMPLANT
LEGGING LITHOTOMY PAIR STRL (DRAPES) ×3 IMPLANT
LIQUID BAND (GAUZE/BANDAGES/DRESSINGS) ×3 IMPLANT
MANIFOLD NEPTUNE II (INSTRUMENTS) ×3 IMPLANT
NDL SAFETY ECLIPSE 18X1.5 (NEEDLE) ×1 IMPLANT
NEEDLE HYPO 18GX1.5 SHARP (NEEDLE) ×2
PACK TOTAL JOINT (CUSTOM PROCEDURE TRAY) ×3 IMPLANT
SPONGE GAUZE 2X2 STER 10/PKG (GAUZE/BANDAGES/DRESSINGS)
SUCTION FRAZIER TIP 10 FR DISP (SUCTIONS) ×3 IMPLANT
SUT MNCRL AB 4-0 PS2 18 (SUTURE) ×3 IMPLANT
SUT VIC AB 1 CT1 36 (SUTURE) ×3 IMPLANT
SUT VIC AB 2-0 CT1 27 (SUTURE) ×4
SUT VIC AB 2-0 CT1 TAPERPNT 27 (SUTURE) ×2 IMPLANT
SUT VLOC 180 0 24IN GS25 (SUTURE) ×3 IMPLANT
SYR 50ML LL SCALE MARK (SYRINGE) ×3 IMPLANT
TOWEL OR 17X26 10 PK STRL BLUE (TOWEL DISPOSABLE) ×3 IMPLANT
TOWEL OR NON WOVEN STRL DISP B (DISPOSABLE) IMPLANT
TRAY FOLEY CATH 14FRSI W/METER (CATHETERS) IMPLANT

## 2014-01-29 NOTE — Anesthesia Procedure Notes (Signed)
Spinal Patient location during procedure: OR End time: 01/29/2014 3:02 PM Staffing Resident/CRNA: Noralyn Pick Performed by: anesthesiologist  Preanesthetic Checklist Completed: patient identified, site marked, surgical consent, pre-op evaluation, timeout performed, IV checked, risks and benefits discussed and monitors and equipment checked Spinal Block Patient position: sitting Prep: Betadine Patient monitoring: heart rate, continuous pulse ox and blood pressure Approach: midline Location: L3-4 Injection technique: single-shot Needle Needle type: Sprotte and Pencil-Tip  Needle gauge: 24 G Needle length: 9 cm Assessment Sensory level: T6 Additional Notes Expiration date of kit checked and confirmed. Patient tolerated procedure well, without complications.

## 2014-01-29 NOTE — Transfer of Care (Signed)
Immediate Anesthesia Transfer of Care Note  Patient: Kelly Elliott  Procedure(s) Performed: Procedure(s): UNICOMPARTMENTAL KNEE LEFT ARTHOPLASTY (Left)  Patient Location: PACU  Anesthesia Type:Spinal  Level of Consciousness: awake, alert  and oriented  Airway & Oxygen Therapy: Patient Spontanous Breathing and Patient connected to nasal cannula oxygen  Post-op Assessment: Report given to PACU RN and Post -op Vital signs reviewed and stable  Post vital signs: Reviewed and stable  Complications: No apparent anesthesia complications

## 2014-01-29 NOTE — Anesthesia Postprocedure Evaluation (Signed)
  Anesthesia Post-op Note  Patient: Kelly Elliott  Procedure(s) Performed: Procedure(s) (LRB): UNICOMPARTMENTAL KNEE LEFT ARTHOPLASTY (Left)  Patient Location: PACU  Anesthesia Type: Spinal  Level of Consciousness: awake and alert   Airway and Oxygen Therapy: Patient Spontanous Breathing  Post-op Pain: mild  Post-op Assessment: Post-op Vital signs reviewed, Patient's Cardiovascular Status Stable, Respiratory Function Stable, Patent Airway and No signs of Nausea or vomiting  Last Vitals:  Filed Vitals:   01/29/14 1830  BP: 132/84  Pulse: 72  Temp: 36.4 C  Resp: 23    Post-op Vital Signs: stable   Complications: Kelly Elliott states "numbness" in all fingers of both hands. Movement intact in both hands. No pain. No neck pain. Reassured patient how her arms/hands were positioned during procedure. Reassured patient that this should resolve over the next few days. Kelly Elliott is to report change or worsening of symptoms.

## 2014-01-29 NOTE — Progress Notes (Signed)
Dr. Delma Post in to see patient- made aware of patient stating  That all her fingers feel numb but that she can tell they are being touched- blanche satisfactorly - bilateral radial pulses palpable

## 2014-01-29 NOTE — Progress Notes (Signed)
Dr. Delma Post made aware of patient's blood pressures and heart rates in PACU- Orders given.

## 2014-01-29 NOTE — Interval H&P Note (Signed)
History and Physical Interval Note:  01/29/2014 2:02 PM  Kelly Elliott  has presented today for surgery, with the diagnosis of LEFTKNEE MEDIAL COMPARTMENTAL OA  The various methods of treatment have been discussed with the patient and family. After consideration of risks, benefits and other options for treatment, the patient has consented to  Procedure(s): UNICOMPARTMENTAL KNEE LEFT ARTHOPLASTY (Left) as a surgical intervention .  The patient's history has been reviewed, patient examined, no change in status, stable for surgery.  I have reviewed the patient's chart and labs.  Questions were answered to the patient's satisfaction.     Mauri Pole

## 2014-01-29 NOTE — Progress Notes (Signed)
500 cc LR IVB given in PACU.

## 2014-01-29 NOTE — Progress Notes (Signed)
Hypoglycemic Event  CBG: 69  Treatment: D50 IV 25 mL  Symptoms: None  Follow-up CBG: Time:1827  CBG Result:119  Possible Reasons for Event: Inadequate meal intake  Comments/MD notified:Denenny    Crutchfield, Vertis Kelch  Remember to initiate Hypoglycemia Order Set & complete

## 2014-01-29 NOTE — Anesthesia Preprocedure Evaluation (Signed)
Anesthesia Evaluation  Patient identified by MRN, date of birth, ID band Patient awake    Reviewed: Allergy & Precautions, H&P , NPO status , Patient's Chart, lab work & pertinent test results  Airway Mallampati: II  TM Distance: >3 FB Neck ROM: Full    Dental no notable dental hx.    Pulmonary neg pulmonary ROS,  breath sounds clear to auscultation  Pulmonary exam normal       Cardiovascular Exercise Tolerance: Good hypertension, Pt. on medications Rhythm:Regular Rate:Normal     Neuro/Psych Anxiety negative neurological ROS     GI/Hepatic negative GI ROS, Neg liver ROS,   Endo/Other  diabetes, Type 2, Oral Hypoglycemic AgentsHypothyroidism   Renal/GU negative Renal ROS  negative genitourinary   Musculoskeletal  (+) Arthritis -,   Abdominal (+) + obese,   Peds negative pediatric ROS (+)  Hematology negative hematology ROS (+)   Anesthesia Other Findings   Reproductive/Obstetrics negative OB ROS                             Anesthesia Physical Anesthesia Plan  ASA: III  Anesthesia Plan:    Post-op Pain Management:    Induction: Intravenous  Airway Management Planned:   Additional Equipment:   Intra-op Plan:   Post-operative Plan: Extubation in OR  Informed Consent: I have reviewed the patients History and Physical, chart, labs and discussed the procedure including the risks, benefits and alternatives for the proposed anesthesia with the patient or authorized representative who has indicated his/her understanding and acceptance.   Dental advisory given  Plan Discussed with: CRNA  Anesthesia Plan Comments: (Discussed spinal and general with FNB. She prefers spinal. Discussed risks/benefits of spinal including headache, backache, failure, bleeding, infection, and nerve damage. Patient consents to spinal. Questions answered. Coagulation studies and platelet count acceptable.)         Anesthesia Quick Evaluation

## 2014-01-30 ENCOUNTER — Encounter (HOSPITAL_COMMUNITY): Payer: Self-pay | Admitting: Orthopedic Surgery

## 2014-01-30 LAB — BASIC METABOLIC PANEL
Anion gap: 7 (ref 5–15)
BUN: 21 mg/dL (ref 6–23)
CALCIUM: 8.2 mg/dL — AB (ref 8.4–10.5)
CO2: 28 mmol/L (ref 19–32)
Chloride: 100 mEq/L (ref 96–112)
Creatinine, Ser: 0.98 mg/dL (ref 0.50–1.10)
GFR calc Af Amer: 66 mL/min — ABNORMAL LOW (ref >90)
GFR, EST NON AFRICAN AMERICAN: 57 mL/min — AB (ref >90)
GLUCOSE: 130 mg/dL — AB (ref 70–99)
Potassium: 4.2 mmol/L (ref 3.5–5.1)
Sodium: 135 mmol/L (ref 135–145)

## 2014-01-30 LAB — GLUCOSE, CAPILLARY
GLUCOSE-CAPILLARY: 177 mg/dL — AB (ref 70–99)
Glucose-Capillary: 136 mg/dL — ABNORMAL HIGH (ref 70–99)
Glucose-Capillary: 204 mg/dL — ABNORMAL HIGH (ref 70–99)
Glucose-Capillary: 174 mg/dL — ABNORMAL HIGH (ref 70–99)

## 2014-01-30 LAB — CBC
HEMATOCRIT: 32.9 % — AB (ref 36.0–46.0)
HEMOGLOBIN: 10.6 g/dL — AB (ref 12.0–15.0)
MCH: 31.7 pg (ref 26.0–34.0)
MCHC: 32.2 g/dL (ref 30.0–36.0)
MCV: 98.5 fL (ref 78.0–100.0)
Platelets: 161 10*3/uL (ref 150–400)
RBC: 3.34 MIL/uL — AB (ref 3.87–5.11)
RDW: 12.5 % (ref 11.5–15.5)
WBC: 8.8 10*3/uL (ref 4.0–10.5)

## 2014-01-30 NOTE — Care Management Note (Signed)
    Page 1 of 2   01/30/2014     10:52:29 AM CARE MANAGEMENT NOTE 01/30/2014  Patient:  LAKARA, WEILAND   Account Number:  [id number]  Date Initiated:  01/30/2014  Documentation initiated by:  [facility]  Subjective/Objective Assessment:   adm: UNICOMPARTMENTAL KNEE LEFT ARTHOPLASTY (Left)        Action/Plan:   discharge planning   Anticipated DC Date:  01/30/2014   Anticipated DC Plan:  [place]  CM consult      [facility] Choice  HOME HEALTH   Choice offered to / List presented to:  C-1 Patient   DME arranged  3-N-1  CPM      DME agency  [facility] arranged  HH-2 PT      [place]   Status of service:  Completed, signed off Medicare Important Message given?   (If response is "NO", the following Medicare IM given date fields will be blank) Date Medicare IM given:   Medicare IM given by:   Date Additional Medicare IM given:   Additional Medicare IM given by:    Discharge Disposition:  [place]  Per UR Regulation:    If discussed at Long Length of Stay Meetings, dates discussed:    Comments:  01/30/14 09:15 Cm met with pt in room to offer choice of home health agency.  Pt chooses [facility] to render HHPT.  Pt will be receiving CPM form [facility] (set up by Leonides Grills of [place] office).  CM called [facility] DME rep, Lecretia to please deliver the 3n1 to room prior to discharge today. Referral called to [place] rep, Tim with address verified with pt and additional contsact is daughter, Olivia Mackie [phone] who will be staying with pt.  No other Cm needs were communicated.  Mariane Masters, BSN, Cm [phone].

## 2014-01-30 NOTE — Progress Notes (Signed)
OT Cancellation Note  Patient Details Name: Kelly Elliott MRN: [id number] DOB: [birth year]   Cancelled Treatment:    Reason Eval/Treat Not Completed: Fatigue/lethargy limiting ability to participate (Pt had just finished PT.  Will continue to follow.)  Malka So 01/30/2014, 3:22 PM

## 2014-01-30 NOTE — Progress Notes (Signed)
Physical Therapy Treatment Patient Details Name: Kelly Elliott MRN: [id number] DOB: Oct 11, 1940 Today's Date: 01/30/2014    History of Present Illness L UKR    PT Comments    Pt progressing steadily with mobility.  Pt's dtr present for final session this date and reviewed don/doff KI.    Follow Up Recommendations  Home health PT     Equipment Recommendations  Rolling walker with 5" wheels    Recommendations for Other Services OT consult     Precautions / Restrictions Precautions Precautions: Knee;Fall Required Braces or Orthoses: Knee Immobilizer - Left Knee Immobilizer - Left: Discontinue once straight leg raise with < 10 degree lag Restrictions Weight Bearing Restrictions: No Other Position/Activity Restrictions: WBAT    Mobility  Bed Mobility Overal bed mobility: Needs Assistance Bed Mobility: Sit to Supine;Supine to Sit     Supine to sit: Min assist;Mod assist Sit to supine: Min assist   General bed mobility comments: cues for sequence and use of R LE to self assist  Transfers Overall transfer level: Needs assistance Equipment used: Rolling walker (2 wheeled) Transfers: Sit to/from Stand Sit to Stand: Min assist;Mod assist         General transfer comment: cues for LE management and use of UEs to self assist  Ambulation/Gait Ambulation/Gait assistance: Min assist Ambulation Distance (Feet): 76 Feet Assistive device: Rolling walker (2 wheeled) Gait Pattern/deviations: Step-to pattern;Decreased step length - right;Decreased step length - left;Shuffle;Trunk flexed Gait velocity: decr   General Gait Details: cues for posture, sequence and position from RW;   Financial trader Rankin (Stroke Patients Only)       Balance                                    Cognition Arousal/Alertness: Awake/alert Behavior During Therapy: WFL for tasks assessed/performed Overall Cognitive Status: Within  Functional Limits for tasks assessed                      Exercises      General Comments        Pertinent Vitals/Pain Pain Assessment: 0-10 Pain Score: 5  Pain Location: L knee Pain Descriptors / Indicators: Aching;Sore Pain Intervention(s): Limited activity within patient's tolerance;Monitored during session;Premedicated before session;Ice applied    Home Living                      Prior Function            PT Goals (current goals can now be found in the care plan section) Acute Rehab PT Goals Patient Stated Goal: Home PT Goal Formulation: With patient Potential to Achieve Goals: Good Progress towards PT goals: Progressing toward goals    Frequency  7X/week    PT Plan Current plan remains appropriate    Co-evaluation             End of Session Equipment Utilized During Treatment: Gait belt;Left knee immobilizer Activity Tolerance: Patient tolerated treatment well;Patient limited by fatigue Patient left: in bed;with call bell/phone within reach     Time: 1430-1513 PT Time Calculation (min) (ACUTE ONLY): 43 min  Charges:  $Gait Training: 23-37 mins $Therapeutic Activity: 8-22 mins                    G Codes:  Kelly Elliott 01/30/2014, 4:20 PM

## 2014-01-30 NOTE — Evaluation (Signed)
Physical Therapy Evaluation Patient Details Name: Kelly Elliott MRN: [id number] DOB: 02/06/1941 Today's Date: 01/30/2014   History of Present Illness  L UKR  Clinical Impression  Pt s/p L UKR presents with decreased strength/ROM and post op pain and nausea limiting functional mobility.  Pt should progress to d/c home with family assist and HHPT follow up.    Follow Up Recommendations Home health PT    Equipment Recommendations  Rolling walker with 5" wheels    Recommendations for Other Services OT consult     Precautions / Restrictions Precautions Precautions: Knee;Fall Required Braces or Orthoses: Knee Immobilizer - Left Knee Immobilizer - Left: Discontinue once straight leg raise with < 10 degree lag Restrictions Weight Bearing Restrictions: No Other Position/Activity Restrictions: WBAT      Mobility  Bed Mobility Overal bed mobility: Needs Assistance Bed Mobility: Supine to Sit     Supine to sit: Mod assist     General bed mobility comments: cues for sequence and use of R LE to self assist  Transfers Overall transfer level: Needs assistance Equipment used: Rolling walker (2 wheeled) Transfers: Sit to/from Stand Sit to Stand: Mod assist;+2 physical assistance;From elevated surface         General transfer comment: cues for LE management and use of UEs to self assist  Ambulation/Gait Ambulation/Gait assistance: Mod assist;+2 physical assistance Ambulation Distance (Feet): 2 Feet Assistive device: Rolling walker (2 wheeled) Gait Pattern/deviations: Step-to pattern;Decreased step length - right;Decreased step length - left;Shuffle;Trunk flexed Gait velocity: decr   General Gait Details: cues for posture, sequence and position from RW; ltd by fatigue and onset of nausea  Stairs            Wheelchair Mobility    Modified Rankin (Stroke Patients Only)       Balance                                             Pertinent  Vitals/Pain Pain Assessment: 0-10 Pain Score: 5  Pain Location: L knee Pain Descriptors / Indicators: Aching;Sore Pain Intervention(s): Limited activity within patient's tolerance;Monitored during session;Premedicated before session;Ice applied    Home Living Family/patient expects to be discharged to:: Private residence Living Arrangements: Alone Available Help at Discharge: Family Type of Home: Apartment Home Access: Level entry     Home Layout: One level Home Equipment: Environmental consultant - 4 wheels      Prior Function Level of Independence: Independent         Comments: Hx of falls     Hand Dominance        Extremity/Trunk Assessment   Upper Extremity Assessment: RUE deficits/detail RUE Deficits / Details: limited use of shoulder since birth         Lower Extremity Assessment: LLE deficits/detail   LLE Deficits / Details: 2-/5 quads with AAROM at knee -10 - 45     Communication   Communication: No difficulties  Cognition Arousal/Alertness: Suspect due to medications;Awake/alert;Lethargic Behavior During Therapy: WFL for tasks assessed/performed Overall Cognitive Status: Within Functional Limits for tasks assessed                      General Comments      Exercises Total Joint Exercises Ankle Circles/Pumps: AROM;Both;15 reps;Supine Quad Sets: AROM;Both;10 reps;Supine Heel Slides: AAROM;15 reps;Left;Supine Straight Leg Raises: AAROM;Left;Supine;15 reps      Assessment/Plan  PT Assessment Patient needs continued PT services  PT Diagnosis Difficulty walking   PT Problem List Decreased strength;Decreased range of motion;Decreased activity tolerance;Decreased mobility;Decreased knowledge of use of DME;Obesity;Pain  PT Treatment Interventions DME instruction;Gait training;Functional mobility training;Therapeutic activities;Therapeutic exercise;Patient/family education   PT Goals (Current goals can be found in the Care Plan section) Acute Rehab PT  Goals Patient Stated Goal: Home PT Goal Formulation: With patient Potential to Achieve Goals: Good    Frequency 7X/week   Barriers to discharge        Co-evaluation               End of Session Equipment Utilized During Treatment: Gait belt;Left knee immobilizer Activity Tolerance: Patient limited by fatigue;Other (comment) (nausea) Patient left: in chair;with call bell/phone within reach Nurse Communication: Mobility status         Time: 0811-0905 PT Time Calculation (min) (ACUTE ONLY): 54 min   Charges:   PT Evaluation $Initial PT Evaluation Tier I: 1 Procedure PT Treatments $Gait Training: 8-22 mins $Therapeutic Exercise: 8-22 mins $Therapeutic Activity: 8-22 mins   PT G Codes:          Kelly Elliott 01/30/2014, 9:42 AM

## 2014-01-30 NOTE — Progress Notes (Signed)
Patient ID: Kelly Elliott, female   DOB: 1941/01/31, 73 y.o.   MRN: [id number] Subjective: 1 Day Post-Op Procedure(s) (LRB): UNICOMPARTMENTAL KNEE LEFT ARTHOPLASTY (Left)    Patient reports pain as moderate.  Still fairly anxious about her pain and the recovery process.  No events over night  Objective:   VITALS:   Filed Vitals:   01/30/14 0916  BP: 152/67  Pulse: 70  Temp: 98.3 F (36.8 C)  Resp: 15    Neurovascular intact Incision: dressing C/D/I  LABS  Recent Labs  01/30/14 0459  HGB 10.6*  HCT 32.9*  WBC 8.8  PLT 161     Recent Labs  01/30/14 0459  NA 135  K 4.2  BUN 21  CREATININE 0.98  GLUCOSE 130*    No results for input(s): LABPT, INR in the last 72 hours.   Assessment/Plan: 1 Day Post-Op Procedure(s) (LRB): UNICOMPARTMENTAL KNEE LEFT ARTHOPLASTY (Left)   Advance diet Up with therapy Discharge home with home health when capable  Will benefit from CPM at home to help with recovery

## 2014-01-30 NOTE — Progress Notes (Signed)
Physical Therapy Treatment Patient Details Name: Kelly Elliott MRN: [id number] DOB: May 22, 1940 Today's Date: 01/30/2014    History of Present Illness L UKR    PT Comments    Marked improvement on second session.  However, pt continues to require increased time and assist for mobility as well as struggling with nausea throughout session.  Follow Up Recommendations  Home health PT     Equipment Recommendations  Rolling walker with 5" wheels    Recommendations for Other Services OT consult     Precautions / Restrictions Precautions Precautions: Knee;Fall Required Braces or Orthoses: Knee Immobilizer - Left Knee Immobilizer - Left: Discontinue once straight leg raise with < 10 degree lag Restrictions Weight Bearing Restrictions: No Other Position/Activity Restrictions: WBAT    Mobility  Bed Mobility Overal bed mobility: Needs Assistance Bed Mobility: Sit to Supine       Sit to supine: Min assist;Mod assist   General bed mobility comments: cues for sequence and use of R LE to self assist  Transfers Overall transfer level: Needs assistance Equipment used: Rolling walker (2 wheeled) Transfers: Sit to/from Stand Sit to Stand: Min assist;Mod assist         General transfer comment: cues for LE management and use of UEs to self assist  Ambulation/Gait Ambulation/Gait assistance: Min assist Ambulation Distance (Feet): 65 Feet Assistive device: Rolling walker (2 wheeled) Gait Pattern/deviations: Step-to pattern;Decreased step length - right;Decreased step length - left;Shuffle;Trunk flexed Gait velocity: decr   General Gait Details: cues for posture, sequence and position from RW;   Financial trader Rankin (Stroke Patients Only)       Balance                                    Cognition Arousal/Alertness: Awake/alert Behavior During Therapy: WFL for tasks assessed/performed Overall Cognitive  Status: Within Functional Limits for tasks assessed                      Exercises      General Comments        Pertinent Vitals/Pain Pain Assessment: 0-10 Pain Score: 5  Pain Location: L knee Pain Descriptors / Indicators: Aching;Sore Pain Intervention(s): Limited activity within patient's tolerance;Monitored during session;Premedicated before session;Ice applied    Home Living                      Prior Function            PT Goals (current goals can now be found in the care plan section) Acute Rehab PT Goals Patient Stated Goal: Home PT Goal Formulation: With patient Potential to Achieve Goals: Good Progress towards PT goals: Progressing toward goals    Frequency  7X/week    PT Plan Current plan remains appropriate    Co-evaluation             End of Session Equipment Utilized During Treatment: Gait belt;Left knee immobilizer Activity Tolerance: Patient tolerated treatment well;Patient limited by fatigue Patient left: in bed;with call bell/phone within reach     Time: 1117-1150 PT Time Calculation (min) (ACUTE ONLY): 33 min  Charges:  $Gait Training: 23-37 mins                    G Codes:  Teriann Livingood 01/30/2014, 1:07 PM

## 2014-01-30 NOTE — Op Note (Signed)
NAME: SYANNE LOONEY    MEDICAL RECORD NO.: [id number]   FACILITY: [facility] BIRTH: May 25, 1950  PHYSICIAN: Pietro Cassis. Alvan Dame, M.D.    DATE OF PROCEDURE: 01/30/2014    OPERATIVE REPORT   PREOPERATIVE DIAGNOSIS: Left knee medial compartment osteoarthritis.   POSTOPERATIVE DIAGNOSIS: Left knee medial compartment osteoarthritis.  PROCEDURE: Left partial knee replacement utilizing Biomet Oxford knee  component, size X-small femur, a left medial size AA tibial tray with a size 5 mm insert.   SURGEON: Pietro Cassis. Alvan Dame, M.D.   ASSISTANT: Danae Orleans, PAC.  Please note that Mr. Guinevere Scarlet was present for the entirety of the case,  utilized for preoperative positioning, perioperative retractor  management, general facilitation of the case and primary wound closure.   ANESTHESIA: Spinal.   SPECIMENS: None.   COMPLICATIONS: None.  DRAINS: None   TOURNIQUET TIME: 33 minutes at 250 mmHg.   INDICATIONS FOR PROCEDURE: The patient is a 73 y.o. patient of mine who presented for evaluation of left knee pain.  They presented with primary complaints of pain on the medial side of their knee. Radiographs revealed advanced medial compartment arthritis with specifically an antero-medial wear pattern.  There was bone on bone changes noted with subchondral sclerosis and osteophytes present. The patient has had progressive problems failing to respond to conservative measures of medications, injections and activity modification. Risks of infection, DVT, component failure, need for future revision surgery were all discussed and reviewed.  Consent was obtained for benefit of pain relief.   PROCEDURE IN DETAIL: The patient was brought to the operative theater.  Once adequate anesthesia, preoperative antibiotics, 2gm of Ancef administered, the patient was positioned in supine position with a left thigh tourniquet  placed. The left lower extremity was prepped and draped in sterile  fashion with the leg on  the Oxford leg holder.  The leg was allowed to flex to 120 degrees. A time-out  was performed identifying the patient, planned procedure, and extremity.  The leg was exsanguinated, tourniquet elevated to 250 mmHg. A midline  incision was made from the proximal pole of the patella to the tibial tubercle. A  soft tissue plane was created and partial median arthrotomy was then  made to allow for subluxation of the patella. Following initial synovectomy and  debridement, the osteophytes were removed off the medial aspect of the  knee.   Attention was first directed to the tibia. The tibial  extramedullary guide was positioned over the anterior crest of the tibia  and pinned into position, and using a measured resection guide from the  [employer]erson system, a 4 mm resection was made off the proximal tibia. The femur was sized to be a size X-small.  First  the reciprocating saw along the medial aspect of the tibial spines, then the oscillating saw.    At this point, I sized this cut surface seem to be best fit for a size AA left medial tibial tray.  With the retractors out of the wound and the knee held at 90 degrees the 4 feeler gauge had appropriate tension on the medial ligament.   At this point, the femoral canal was opened with a drill and the  intramedullary rod passed. Then using the guide for a 4 mm resection off  the posterior aspect of the femur was positioned over the mid portion of the medial femoral condyle.  The orientation was set using the guide that mates the femoral guide to the intramedullary rod.  The 2  drill holes were made into the distal femur.  The posterior guide was then impacted into place and the posterior  femoral cut made.  At this point, I milled the distal femur with a size 4 spigot in place. At this point, we did a trial reduction of the X-small femur, size AA left medial tibial tray and a size 4-5 feeler gauge. At 90 degrees of  flexion and at 20 degrees of flexion the  knee had symmetric tension on  the ligaments.   Given these findings, the trial femoral component was removed. Final preparation of tibia was carried out by pinning it in position. Then  using a reciprocating saw I removed bone for the keel. Further bone was  removed with an osteotome.  Trial reduction was now carried out with the X-small  femur, the left medial AA tibia, and a size 5 lollipop insert. The balance of the  ligaments appeared to be symmetric at 20 degrees and 90 degrees. Given  all these findings, the trial components were removed.   Cement was mixed. The final components were opened. The knee was irrigated with  normal saline solution. Then final debridements of the  soft tissue was carried out, I also drilled the sclerotic bone with a drill.  The final components were cemented with a single batch of cement in a  two-stage technique with the tibial component cemented first. The knee  was then brought  to 45 degrees of flexion with a 5 feeler gauge, held with pressure for a minute and half.  After this the femoral component was cemented in place.  The knee was again held at 45 degrees of flexion while the cement fully cured.  Excess cement was removed throughout the knee. The synovial-capsular junction was injected with combination of 30cc of Marcaine with epi, 1cc of Toradol, and 30cc of NS.  The tourniquet was let down  after 33 minutes. After the cement had fully cured and excessive cement  was removed throughout the knee there was no visualized cement present.   The final X-small left medial size 5 mm insert was chosen and snapped into position. We re-irrigated  the knee. The extensor mechanism  was then reapproximated using a #1 Vicryl and #0 V-lock sutures with the knee in flexion. The  remaining wound was closed with 2-0 Vicryl and a running 4-0 Monocryl.  The knee was cleaned, dried, and dressed sterilely using Dermabond and  Aquacel dressing. The patient  was  brought to the recovery room, Ace wrap in place, tolerating the  procedure well. He will be in the hospital for overnight observation.  We will initiate physical therapy and progress to ambulate.     Pietro Cassis Alvan Dame, M.D.

## 2014-01-31 LAB — BASIC METABOLIC PANEL
ANION GAP: 4 — AB (ref 5–15)
BUN: 18 mg/dL (ref 6–23)
CO2: 28 mmol/L (ref 19–32)
CREATININE: 0.82 mg/dL (ref 0.50–1.10)
Calcium: 8.5 mg/dL (ref 8.4–10.5)
Chloride: 102 mEq/L (ref 96–112)
GFR calc Af Amer: 81 mL/min — ABNORMAL LOW (ref >90)
GFR calc non Af Amer: 70 mL/min — ABNORMAL LOW (ref >90)
GLUCOSE: 130 mg/dL — AB (ref 70–99)
Potassium: 4.5 mmol/L (ref 3.5–5.1)
SODIUM: 134 mmol/L — AB (ref 135–145)

## 2014-01-31 LAB — CBC
HCT: 33.9 % — ABNORMAL LOW (ref 36.0–46.0)
HEMOGLOBIN: 11.2 g/dL — AB (ref 12.0–15.0)
MCH: 32.7 pg (ref 26.0–34.0)
MCHC: 33 g/dL (ref 30.0–36.0)
MCV: 98.8 fL (ref 78.0–100.0)
Platelets: 154 10*3/uL (ref 150–400)
RBC: 3.43 MIL/uL — AB (ref 3.87–5.11)
RDW: 12.4 % (ref 11.5–15.5)
WBC: 9.7 10*3/uL (ref 4.0–10.5)

## 2014-01-31 LAB — GLUCOSE, CAPILLARY: GLUCOSE-CAPILLARY: 125 mg/dL — AB (ref 70–99)

## 2014-01-31 MED ORDER — TIZANIDINE HCL 4 MG PO TABS: 4.0000 mg | ORAL_TABLET | Freq: Four times a day (QID) | ORAL | Status: DC | PRN

## 2014-01-31 MED ORDER — OXYCODONE HCL 5 MG PO TABS: 5.0000 mg | ORAL_TABLET | ORAL | Status: DC | PRN

## 2014-01-31 MED ORDER — OXYCODONE HCL 5 MG PO TABS
5.0000 mg | ORAL_TABLET | ORAL | Status: DC
  Administered 2014-01-31 (×2): 10 mg via ORAL
  Filled 2014-01-31 (×2): qty 2

## 2014-01-31 MED ORDER — FERROUS SULFATE 325 (65 FE) MG PO TABS
325.0000 mg | ORAL_TABLET | Freq: Three times a day (TID) | ORAL | Status: DC
Start: 2014-01-31 — End: 2015-08-30

## 2014-01-31 MED ORDER — ASPIRIN 325 MG PO TBEC: 325.0000 mg | DELAYED_RELEASE_TABLET | Freq: Two times a day (BID) | ORAL | Status: AC

## 2014-01-31 MED ORDER — DSS 100 MG PO CAPS: 100.0000 mg | ORAL_CAPSULE | Freq: Two times a day (BID) | ORAL | Status: DC

## 2014-01-31 MED ORDER — POLYETHYLENE GLYCOL 3350 17 G PO PACK: 17.0000 g | PACK | Freq: Two times a day (BID) | ORAL | Status: DC

## 2014-01-31 NOTE — Progress Notes (Signed)
     Subjective: 2 Days Post-Op Procedure(s) (LRB): UNICOMPARTMENTAL KNEE LEFT ARTHOPLASTY (Left)   Seen by Dr. Alvan Dame. Patient reports pain as mild, pain controlled. No events throughout the night. Ready to be discharged home.  Objective:   VITALS:   Filed Vitals:   01/31/14  BP: 190/82  Pulse: 75  Temp: 98.4 F (36.9 C)  Resp: 14    Dorsiflexion/Plantar flexion intact Incision: dressing C/D/I No cellulitis present Compartment soft  LABS  Recent Labs  01/30/14 0459 01/31/14 0455  HGB 10.6* 11.2*  HCT 32.9* 33.9*  WBC 8.8 9.7  PLT 161 154     Recent Labs  01/30/14 0459 01/31/14 0455  NA 135 134*  K 4.2 4.5  BUN 21 18  CREATININE 0.98 0.82  GLUCOSE 130* 130*     Assessment/Plan: 2 Days Post-Op Procedure(s) (LRB): UNICOMPARTMENTAL KNEE LEFT ARTHOPLASTY (Left) Up with therapy Discharge home with home health  Follow up in 2 weeks at [facility]. Follow up with OLIN,Phylicia Mcgaugh D in 2 weeks.  Contact information:  [facility] [address], [address] [place]   PAC  01/31/2014, 10:17 AM

## 2014-01-31 NOTE — Progress Notes (Signed)
Physical Therapy Treatment Patient Details Name: Kelly Elliott MRN: [id number] DOB: 08-23-1940 Today's Date: 01/31/2014    History of Present Illness L UKR    PT Comments    Noted improvement in mobility and stability with ambulation.  Pt's dtr present and reviewed don/doff KI and use of RW. Pt and dtr both eager for dc  Follow Up Recommendations  Home health PT     Equipment Recommendations  Rolling walker with 5" wheels    Recommendations for Other Services OT consult     Precautions / Restrictions Precautions Precautions: Knee;Fall Required Braces or Orthoses: Knee Immobilizer - Left Knee Immobilizer - Left: Discontinue once straight leg raise with < 10 degree lag Restrictions Weight Bearing Restrictions: No Other Position/Activity Restrictions: WBAT    Mobility  Bed Mobility Overal bed mobility: Needs Assistance Bed Mobility: Sit to Supine;Supine to Sit     Supine to sit: Min assist Sit to supine: Min assist   General bed mobility comments: cues for sequence and use of R LE to self assist  Transfers Overall transfer level: Needs assistance Equipment used: Rolling walker (2 wheeled) Transfers: Sit to/from Stand Sit to Stand: Min assist         General transfer comment: cues for LE management and use of UEs to self assist  Ambulation/Gait Ambulation/Gait assistance: Min assist;Min guard Ambulation Distance (Feet): 40 Feet Assistive device: Rolling walker (2 wheeled) Gait Pattern/deviations: Step-to pattern;Decreased step length - right;Decreased step length - left;Shuffle;Trunk flexed Gait velocity: decr   General Gait Details: cues for posture, sequence and position from RW;   Science writer    Modified Rankin (Stroke Patients Only)       Balance                                    Cognition Arousal/Alertness: Awake/alert Behavior During Therapy: WFL for tasks assessed/performed Overall  Cognitive Status: Within Functional Limits for tasks assessed                      Exercises Total Joint Exercises Ankle Circles/Pumps: AROM;Both;15 reps;Supine Quad Sets: AROM;Both;Supine;20 reps Heel Slides: AAROM;Left;Supine;20 reps Straight Leg Raises: AAROM;Left;Supine;20 reps    General Comments        Pertinent Vitals/Pain Pain Assessment: 0-10 Pain Score: 5  Pain Location: L knee Pain Descriptors / Indicators: Sore Pain Intervention(s): Premedicated before session;Monitored during session;Limited activity within patient's tolerance;Ice applied    Home Living                      Prior Function            PT Goals (current goals can now be found in the care plan section) Acute Rehab PT Goals Patient Stated Goal: Home PT Goal Formulation: With patient Potential to Achieve Goals: Good Progress towards PT goals: Progressing toward goals    Frequency  7X/week    PT Plan Current plan remains appropriate    Co-evaluation             End of Session Equipment Utilized During Treatment: Gait belt;Left knee immobilizer Activity Tolerance: Patient tolerated treatment well;Patient limited by fatigue Patient left: in bed;with call bell/phone within reach     Time: 1011-1057 PT Time Calculation (min) (ACUTE ONLY): 46 min  Charges:  $Gait Training: 8-22 mins $Therapeutic Exercise:  8-22 mins $Therapeutic Activity: 8-22 mins                    G Codes:      Dannya Pitkin 2014-02-19, 4:27 PM

## 2014-02-08 NOTE — Discharge Summary (Signed)
Physician Discharge Summary  Patient ID: Kelly Elliott MRN: [id number] DOB/AGE: 71-Nov-1942 71 y.o.  Admit date: 01/29/2014 Discharge date: 01/31/2014   Procedures:  Procedure(s) (LRB): UNICOMPARTMENTAL KNEE LEFT ARTHOPLASTY (Left)  Attending Physician:  Dr. Paralee Cancel   Admission Diagnoses:   Left knee medial compartment primary OA / pain  Discharge Diagnoses:  Principal Problem:   S/P left UKR  Past Medical History  Diagnosis Date  . Anxiety   . Diabetes mellitus   . Hypercholesterolemia   . Hypertension   . Hypothyroidism   . Lumbar pain   . Herniated disc   . Arthritis   . Hemorrhoids     HPI: Kelly Elliott, 73 y.o. female, has a history of pain and functional disability in the left knee due to arthritis and has failed non-surgical conservative treatments for greater than 12 weeks to include NSAID's and/or analgesics, use of assistive devices and activity modification. Onset of symptoms was gradual, starting ~9 years ago with gradually worsening course since that time. The patient noted no past surgery on the left knee(s). Patient currently rates pain in the left knee(s) at 7 out of 10 with activity. Patient has worsening of pain with activity and weight bearing, pain that interferes with activities of daily living, pain with passive range of motion, crepitus and joint swelling. Patient has evidence of periarticular osteophytes and joint space narrowing by imaging studies. There is no active infection. Risks, benefits and expectations were discussed with the patient. Risks including but not limited to the risk of anesthesia, blood clots, nerve damage, blood vessel damage, failure of the prosthesis, infection and up to and including death. Patient understand the risks, benefits and expectations and wishes to proceed with surgery.   PCP: Garnet Koyanagi, DO   Discharged Condition: good  Hospital Course:  Patient underwent the above stated procedure on 01/29/2014.  Patient tolerated the procedure well and brought to the recovery room in good condition and subsequently to the floor.  POD #1 BP: 152/67 ; Pulse: 70 ; Temp: 98.3 F (36.8 C) ; Resp: 15 Patient reports pain as moderate. Still fairly anxious about her pain and the recovery process. No events over night. Dorsiflexion/plantar flexion intact, incision: dressing C/D/I, no cellulitis present and compartment soft.   LABS  Basename    HGB  10.6  HCT  32.9   POD #2  BP: 190/82 ; Pulse: 75 ; Temp: 98.4 F (36.9 C) ; Resp: 14 Patient reports pain as mild, pain controlled. No events throughout the night. Ready to be discharged home. Dorsiflexion/plantar flexion intact, incision: dressing C/D/I, no cellulitis present and compartment soft.   LABS  Basename    HGB  11.2  HCT  33.9    Discharge Exam: General appearance: alert, cooperative and no distress Extremities: Homans sign is negative, no sign of DVT, no edema, redness or tenderness in the calves or thighs and no ulcers, gangrene or trophic changes  Disposition: Home with follow up in 2 weeks   Follow-up Information    Follow up with [facility].   Why:  for your CPM (continuous passive motion) machine   Contact information:   [phone]      Follow up with [facility].   Why:  home health physical therapy   Contact information:   [place] [address] [phone]       Follow up with [facility].   Why:  3n1 (commode)  Contact information:   [address] [place] [address] [phone]       Follow up with Mauri Pole, MD. Schedule an appointment as soon as possible for a visit in 2 weeks.   Specialty:  Orthopedic Surgery   Contact information:   [address] [place] [address] [phone]       Discharge Instructions    Call MD / Call 911    Complete by:  As directed   If you experience chest pain or shortness  of breath, CALL 911 and be transported to the hospital emergency room.  If you develope a fever above 101 F, pus (white drainage) or increased drainage or redness at the wound, or calf pain, call your surgeon's office.     Change dressing    Complete by:  As directed   Maintain surgical dressing until follow up in the clinic. If the edges start to pull up, may reinforce with tape. If the dressing is no longer working, may remove and cover with gauze and tape, but must keep the area dry and clean.  Call with any questions or concerns.     Constipation Prevention    Complete by:  As directed   Drink plenty of fluids.  Prune juice may be helpful.  You may use a stool softener, such as Colace (over the counter) 100 mg twice a day.  Use MiraLax (over the counter) for constipation as needed.     Diet - low sodium heart healthy    Complete by:  As directed      Discharge instructions    Complete by:  As directed   Maintain surgical dressing until follow up in the clinic. If the edges start to pull up, may reinforce with tape. If the dressing is no longer working, may remove and cover with gauze and tape, but must keep the area dry and clean.  Follow up in 2 weeks at [facility]. Call with any questions or concerns.     Increase activity slowly as tolerated    Complete by:  As directed      TED hose    Complete by:  As directed   Use stockings (TED hose) for 2 weeks on both leg(s).  You may remove them at night for sleeping.     Weight bearing as tolerated    Complete by:  As directed   Laterality:  left  Extremity:  Lower             Medication List    STOP taking these medications        aspirin 81 MG tablet  Replaced by:  aspirin 325 MG EC tablet      TAKE these medications        ALPRAZolam 1 MG 24 hr tablet  Commonly known as:  XANAX XR  Take 1 mg by mouth as needed for anxiety.     aspirin 325 MG EC tablet  Take 1 tablet (325 mg total) by mouth 2 (two) times daily.      atorvastatin 40 MG tablet  Commonly known as:  LIPITOR  Take 1 tablet (40 mg total) by mouth daily.     busPIRone 30 MG tablet  Commonly known as:  BUSPAR  Take 1 tablet (30 mg total) by mouth 2 (two) times daily.     CENTRUM SILVER ULTRA WOMENS Tabs  Take 1 tablet by mouth every morning.     DSS 100 MG Caps  Take 100 mg by mouth 2 (two) times daily.     ferrous sulfate 325 (65 FE) MG tablet  Take 1 tablet (325 mg total) by mouth 3 (three) times daily after meals.     glipiZIDE 2.5 MG 24 hr tablet  Commonly known as:  GLUCOTROL XL  Take 1 tablet (2.5 mg total) by mouth daily.     glucose blood test strip  Commonly known as:  ONETOUCH VERIO  CHECK BLOOD SUGAR ONCE DAILY     levothyroxine 88 MCG tablet  Commonly known as:  SYNTHROID, LEVOTHROID  1 tab by mouth daily.     lisinopril 20 MG tablet  Commonly known as:  PRINIVIL,ZESTRIL  TAKE TWO (2) TABLETS BY MOUTH EVERY MORNING AND TAKE ONE TABLET BY MOUTH EVERY EVENING     LORazepam 1 MG tablet  Commonly known as:  ATIVAN  TAKE ONE TABLET BY MOUTH AS NEEDED     ONETOUCH DELICA LANCETS FINE Misc  1 Device by Does not apply route daily. CHECK BLOOD SUGAR ONCE DAILY     oxyCODONE 5 MG immediate release tablet  Commonly known as:  Oxy IR/ROXICODONE  Take 1-2 tablets (5-10 mg total) by mouth every 4 (four) hours as needed for severe pain.     polyethylene glycol packet  Commonly known as:  MIRALAX / GLYCOLAX  Take 17 g by mouth 2 (two) times daily.     tiZANidine 4 MG tablet  Commonly known as:  ZANAFLEX  Take 1 tablet (4 mg total) by mouth every 6 (six) hours as needed for muscle spasms.     triamcinolone ointment 0.1 %  Commonly known as:  KENALOG  Apply 1 application topically daily as needed (eczema).     TYLENOL ARTHRITIS PAIN 650 MG CR tablet  Generic drug:  acetaminophen  Take 650 mg by mouth every 8 (eight) hours as needed for pain.     Vitamin D 2000 UNITS Caps  Take 1 capsule by mouth every  morning.     zolpidem 5 MG tablet  Commonly known as:  AMBIEN  TAKE ONE TABLET BY MOUTH EVERY THIRD NIGHT AT BEDTIME AS NEEDED         Signed: [place]. Drayke Grabel   PA-C  02/08/2014, 12:25 PM

## 2014-02-13 ENCOUNTER — Telehealth: Payer: Self-pay | Admitting: *Deleted

## 2014-02-13 DIAGNOSIS — I10 Essential (primary) hypertension: Secondary | ICD-10-CM

## 2014-02-13 DIAGNOSIS — G4700 Insomnia, unspecified: Secondary | ICD-10-CM

## 2014-02-13 MED ORDER — ZOLPIDEM TARTRATE 5 MG PO TABS: ORAL_TABLET | ORAL | Status: DC

## 2014-02-13 NOTE — Telephone Encounter (Signed)
Patient requesting Zolpidem.  Last seen 12/25/13.  Last refill 11/02/13 for 30 with 0.    Please advise.       EAL

## 2014-02-13 NOTE — Telephone Encounter (Signed)
Rx faxed.    KP 

## 2014-02-13 NOTE — Telephone Encounter (Signed)
Refill x1   1 refill 

## 2014-02-22 ENCOUNTER — Telehealth: Payer: Self-pay | Admitting: Family Medicine

## 2014-02-22 NOTE — Telephone Encounter (Signed)
Refill x1   1 refill 

## 2014-02-22 NOTE — Telephone Encounter (Signed)
Caller name: [facility]  Relation to [id number] Call back number: [phone]  Reason for call:  Pharmacy requesting a refill LORazepam (ATIVAN) 1 MG tablet

## 2014-02-22 NOTE — Telephone Encounter (Signed)
Last seen 12/25/13 and filled 12/21/13  UDS 02/04/12 Low risk    Please advise      KP

## 2014-02-23 MED ORDER — LORAZEPAM 1 MG PO TABS: 1.0000 mg | ORAL_TABLET | ORAL | Status: DC | PRN

## 2014-02-23 NOTE — Telephone Encounter (Signed)
Rx faxed.    KP 

## 2014-03-05 ENCOUNTER — Other Ambulatory Visit: Payer: Self-pay

## 2014-03-05 DIAGNOSIS — E119 Type 2 diabetes mellitus without complications: Secondary | ICD-10-CM

## 2014-03-05 MED ORDER — GLIPIZIDE ER 2.5 MG PO TB24: 2.5000 mg | ORAL_TABLET | Freq: Every day | ORAL | Status: DC

## 2014-03-26 ENCOUNTER — Encounter: Payer: Medicare Other | Admitting: Family Medicine

## 2014-04-18 ENCOUNTER — Telehealth: Payer: Self-pay

## 2014-04-18 NOTE — Telephone Encounter (Signed)
Received refill request from [facility].  Pt requesting a refill on Lorazepam 1 mg tablet.    Last filled: 02/23/14 Amt: 30, 1  Last OV:  12/25/13 Contract on file UDS-LOW risk  Please advise.

## 2014-04-19 ENCOUNTER — Other Ambulatory Visit: Payer: Self-pay | Admitting: Family Medicine

## 2014-04-19 MED ORDER — LORAZEPAM 1 MG PO TABS: 1.0000 mg | ORAL_TABLET | ORAL | Status: DC | PRN

## 2014-04-19 NOTE — Telephone Encounter (Signed)
Print #30, no RF 

## 2014-04-19 NOTE — Telephone Encounter (Signed)
Rx printed and signed by Dr. Larose Kells, in Dr. Nonda Lou absence.

## 2014-04-19 NOTE — Telephone Encounter (Signed)
Dr. Nonda Lou out of office.  Please advise.

## 2014-04-19 NOTE — Telephone Encounter (Signed)
Rx faxed to [facility] as requested.

## 2014-04-19 NOTE — Telephone Encounter (Signed)
Last filled

## 2014-04-24 ENCOUNTER — Ambulatory Visit (HOSPITAL_COMMUNITY)
Admission: RE | Admit: 2014-04-24 | Discharge: 2014-04-24 | Disposition: A | Discharge status: Home / Self Care | Payer: Medicare Other | Source: Ambulatory Visit | Attending: Cardiology | Admitting: Cardiology

## 2014-04-24 ENCOUNTER — Other Ambulatory Visit (HOSPITAL_COMMUNITY): Payer: Self-pay | Admitting: Orthopedic Surgery

## 2014-04-24 DIAGNOSIS — M7989 Other specified soft tissue disorders: Secondary | ICD-10-CM

## 2014-04-24 DIAGNOSIS — Z471 Aftercare following joint replacement surgery: Secondary | ICD-10-CM | POA: Diagnosis not present

## 2014-04-24 DIAGNOSIS — Z9660 Presence of unspecified orthopedic joint implant: Secondary | ICD-10-CM | POA: Insufficient documentation

## 2014-04-24 DIAGNOSIS — Z96698 Presence of other orthopedic joint implants: Principal | ICD-10-CM

## 2014-04-24 NOTE — Progress Notes (Signed)
Left Lower Extremity Venous Duplex Completed. No evidence for DVT or SVT. °Brianna L Mazza,RVT °

## 2014-05-02 ENCOUNTER — Telehealth: Payer: Self-pay | Admitting: Family Medicine

## 2014-05-02 NOTE — Telephone Encounter (Signed)
pre visit letter sent °

## 2014-05-15 ENCOUNTER — Other Ambulatory Visit: Payer: Self-pay | Admitting: Internal Medicine

## 2014-05-15 NOTE — Telephone Encounter (Signed)
Last seen 12/25/14 and filled 04/19/14 #30   Please advise     KP

## 2014-05-21 ENCOUNTER — Encounter: Payer: Medicare Other | Admitting: Family Medicine

## 2014-06-04 ENCOUNTER — Other Ambulatory Visit: Payer: Self-pay | Admitting: Family Medicine

## 2014-06-14 ENCOUNTER — Other Ambulatory Visit: Payer: Self-pay | Admitting: Family Medicine

## 2014-06-27 ENCOUNTER — Telehealth: Payer: Self-pay | Admitting: Family Medicine

## 2014-06-27 NOTE — Telephone Encounter (Signed)
Pre Visit letter sent  °

## 2014-07-02 ENCOUNTER — Other Ambulatory Visit: Payer: Self-pay | Admitting: Family Medicine

## 2014-07-02 NOTE — Telephone Encounter (Signed)
[place] patient  Last seen 12/25/13 and filled 1/5/216 #30 with 1 rf UDS 02/04/12 low risk  Please advise     KP

## 2014-07-12 ENCOUNTER — Other Ambulatory Visit: Payer: Self-pay | Admitting: Family Medicine

## 2014-07-12 DIAGNOSIS — Z1231 Encounter for screening mammogram for malignant neoplasm of breast: Secondary | ICD-10-CM

## 2014-07-12 NOTE — Telephone Encounter (Signed)
Last seen 12/25/13 and filled 05/15/14 #30 with 1 refill    Please advise     KP

## 2014-07-18 ENCOUNTER — Telehealth: Payer: Self-pay

## 2014-07-18 NOTE — Telephone Encounter (Signed)
Pre visit call completed 

## 2014-07-19 ENCOUNTER — Ambulatory Visit (INDEPENDENT_AMBULATORY_CARE_PROVIDER_SITE_OTHER): Payer: Medicare Other | Admitting: Family Medicine

## 2014-07-19 ENCOUNTER — Encounter: Payer: Self-pay | Admitting: Family Medicine

## 2014-07-19 VITALS — BP 128/84 | HR 80 | Temp 97.7°F | Ht 64.0 in | Wt 207.0 lb

## 2014-07-19 DIAGNOSIS — M5441 Lumbago with sciatica, right side: Secondary | ICD-10-CM | POA: Diagnosis not present

## 2014-07-19 DIAGNOSIS — E119 Type 2 diabetes mellitus without complications: Secondary | ICD-10-CM

## 2014-07-19 DIAGNOSIS — Z0000 Encounter for general adult medical examination without abnormal findings: Secondary | ICD-10-CM | POA: Diagnosis not present

## 2014-07-19 DIAGNOSIS — Z23 Encounter for immunization: Secondary | ICD-10-CM

## 2014-07-19 DIAGNOSIS — E039 Hypothyroidism, unspecified: Secondary | ICD-10-CM

## 2014-07-19 DIAGNOSIS — I10 Essential (primary) hypertension: Secondary | ICD-10-CM | POA: Diagnosis not present

## 2014-07-19 DIAGNOSIS — E785 Hyperlipidemia, unspecified: Secondary | ICD-10-CM | POA: Diagnosis not present

## 2014-07-19 DIAGNOSIS — M10079 Idiopathic gout, unspecified ankle and foot: Secondary | ICD-10-CM | POA: Diagnosis not present

## 2014-07-19 LAB — BASIC METABOLIC PANEL
BUN: 18 mg/dL (ref 6–23)
CALCIUM: 9.3 mg/dL (ref 8.4–10.5)
CO2: 28 mEq/L (ref 19–32)
Chloride: 95 mEq/L — ABNORMAL LOW (ref 96–112)
Creatinine, Ser: 0.71 mg/dL (ref 0.40–1.20)
GFR: 86.06 mL/min (ref >60.00)
GLUCOSE: 129 mg/dL — AB (ref 70–99)
POTASSIUM: 3.9 meq/L (ref 3.5–5.1)
SODIUM: 130 meq/L — AB (ref 135–145)

## 2014-07-19 LAB — CBC WITH DIFFERENTIAL/PLATELET
Basophils Absolute: 0 10*3/uL (ref 0.0–0.1)
Basophils Relative: 0.6 % (ref 0.0–3.0)
EOS ABS: 0.1 10*3/uL (ref 0.0–0.7)
Eosinophils Relative: 1.3 % (ref 0.0–5.0)
HCT: 40.9 % (ref 36.0–46.0)
Hemoglobin: 13.9 g/dL (ref 12.0–15.0)
LYMPHS ABS: 2 10*3/uL (ref 0.7–4.0)
Lymphocytes Relative: 23.7 % (ref 12.0–46.0)
MCHC: 34 g/dL (ref 30.0–36.0)
MCV: 92.7 fl (ref 78.0–100.0)
MONOS PCT: 9.6 % (ref 3.0–12.0)
Monocytes Absolute: 0.8 10*3/uL (ref 0.1–1.0)
NEUTROS ABS: 5.5 10*3/uL (ref 1.4–7.7)
Neutrophils Relative %: 64.8 % (ref 43.0–77.0)
Platelets: 229 10*3/uL (ref 150.0–400.0)
RBC: 4.41 Mil/uL (ref 3.87–5.11)
RDW: 13.7 % (ref 11.5–15.5)
WBC: 8.5 10*3/uL (ref 4.0–10.5)

## 2014-07-19 LAB — HEPATIC FUNCTION PANEL
ALBUMIN: 4.3 g/dL (ref 3.5–5.2)
ALT: 15 U/L (ref 0–35)
AST: 19 U/L (ref 0–37)
Alkaline Phosphatase: 59 U/L (ref 39–117)
Bilirubin, Direct: 0.1 mg/dL (ref 0.0–0.3)
TOTAL PROTEIN: 7.7 g/dL (ref 6.0–8.3)
Total Bilirubin: 0.5 mg/dL (ref 0.2–1.2)

## 2014-07-19 LAB — LIPID PANEL
CHOL/HDL RATIO: 4
CHOLESTEROL: 229 mg/dL — AB (ref 0–200)
HDL: 62.8 mg/dL (ref >39.00)
LDL CALC: 140 mg/dL — AB (ref 0–99)
NonHDL: 166.2
TRIGLYCERIDES: 129 mg/dL (ref 0.0–149.0)
VLDL: 25.8 mg/dL (ref 0.0–40.0)

## 2014-07-19 LAB — TSH: TSH: 8.03 u[IU]/mL — ABNORMAL HIGH (ref 0.35–4.50)

## 2014-07-19 LAB — URIC ACID: URIC ACID, SERUM: 5.2 mg/dL (ref 2.4–7.0)

## 2014-07-19 LAB — HEMOGLOBIN A1C: Hgb A1c MFr Bld: 6 % (ref 4.6–6.5)

## 2014-07-19 MED ORDER — TETANUS-DIPHTH-ACELL PERTUSSIS 5-2-15.5 LF-MCG/0.5 IM SUSP: 0.5000 mL | Freq: Once | INTRAMUSCULAR | Status: DC

## 2014-07-19 MED ORDER — HYDROCODONE-ACETAMINOPHEN 5-325 MG PO TABS: 1.0000 | ORAL_TABLET | Freq: Four times a day (QID) | ORAL | Status: DC | PRN

## 2014-07-19 NOTE — Progress Notes (Signed)
Pre visit review using our clinic review tool, if applicable. No additional management support is needed unless otherwise documented below in the visit note. 

## 2014-07-19 NOTE — Patient Instructions (Signed)
Preventive Care for Adults A healthy lifestyle and preventive care can promote health and wellness. Preventive health guidelines for women include the following key practices.  A routine yearly physical is a good way to check with your health care provider about your health and preventive screening. It is a chance to share any concerns and updates on your health and to receive a thorough exam.  Visit your dentist for a routine exam and preventive care every 6 months. Brush your teeth twice a day and floss once a day. Good oral hygiene prevents tooth decay and gum disease.  The frequency of eye exams is based on your age, health, family medical history, use of contact lenses, and other factors. Follow your health care provider's recommendations for frequency of eye exams.  Eat a healthy diet. Foods like vegetables, fruits, whole grains, low-fat dairy products, and lean protein foods contain the nutrients you need without too many calories. Decrease your intake of foods high in solid fats, added sugars, and salt. Eat the right amount of calories for you.Get information about a proper diet from your health care provider, if necessary.  Regular physical exercise is one of the most important things you can do for your health. Most adults should get at least 150 minutes of moderate-intensity exercise (any activity that increases your heart rate and causes you to sweat) each week. In addition, most adults need muscle-strengthening exercises on 2 or more days a week.  Maintain a healthy weight. The body mass index (BMI) is a screening tool to identify possible weight problems. It provides an estimate of body fat based on height and weight. Your health care provider can find your BMI and can help you achieve or maintain a healthy weight.For adults 20 years and older:  A BMI below 18.5 is considered underweight.  A BMI of 18.5 to 24.9 is normal.  A BMI of 25 to 29.9 is considered overweight.  A BMI of  30 and above is considered obese.  Maintain normal blood lipids and cholesterol levels by exercising and minimizing your intake of saturated fat. Eat a balanced diet with plenty of fruit and vegetables. Blood tests for lipids and cholesterol should begin at age 76 and be repeated every 5 years. If your lipid or cholesterol levels are high, you are over 50, or you are at high risk for heart disease, you may need your cholesterol levels checked more frequently.Ongoing high lipid and cholesterol levels should be treated with medicines if diet and exercise are not working.  If you smoke, find out from your health care provider how to quit. If you do not use tobacco, do not start.  Lung cancer screening is recommended for adults aged 22-80 years who are at high risk for developing lung cancer because of a history of smoking. A yearly low-dose CT scan of the lungs is recommended for people who have at least a 30-pack-year history of smoking and are a current smoker or have quit within the past 15 years. A pack year of smoking is smoking an average of 1 pack of cigarettes a day for 1 year (for example: 1 pack a day for 30 years or 2 packs a day for 15 years). Yearly screening should continue until the smoker has stopped smoking for at least 15 years. Yearly screening should be stopped for people who develop a health problem that would prevent them from having lung cancer treatment.  If you are pregnant, do not drink alcohol. If you are breastfeeding,  be very cautious about drinking alcohol. If you are not pregnant and choose to drink alcohol, do not have more than 1 drink per day. One drink is considered to be 12 ounces (355 mL) of beer, 5 ounces (148 mL) of wine, or 1.5 ounces (44 mL) of liquor.  Avoid use of street drugs. Do not share needles with anyone. Ask for help if you need support or instructions about stopping the use of drugs.  High blood pressure causes heart disease and increases the risk of  stroke. Your blood pressure should be checked at least every 1 to 2 years. Ongoing high blood pressure should be treated with medicines if weight loss and exercise do not work.  If you are 75-52 years old, ask your health care provider if you should take aspirin to prevent strokes.  Diabetes screening involves taking a blood sample to check your fasting blood sugar level. This should be done once every 3 years, after age 15, if you are within normal weight and without risk factors for diabetes. Testing should be considered at a younger age or be carried out more frequently if you are overweight and have at least 1 risk factor for diabetes.  Breast cancer screening is essential preventive care for women. You should practice "breast self-awareness." This means understanding the normal appearance and feel of your breasts and may include breast self-examination. Any changes detected, no matter how small, should be reported to a health care provider. Women in their 58s and 30s should have a clinical breast exam (CBE) by a health care provider as part of a regular health exam every 1 to 3 years. After age 16, women should have a CBE every year. Starting at age 53, women should consider having a mammogram (breast X-ray test) every year. Women who have a family history of breast cancer should talk to their health care provider about genetic screening. Women at a high risk of breast cancer should talk to their health care providers about having an MRI and a mammogram every year.  Breast cancer gene (BRCA)-related cancer risk assessment is recommended for women who have family members with BRCA-related cancers. BRCA-related cancers include breast, ovarian, tubal, and peritoneal cancers. Having family members with these cancers may be associated with an increased risk for harmful changes (mutations) in the breast cancer genes BRCA1 and BRCA2. Results of the assessment will determine the need for genetic counseling and  BRCA1 and BRCA2 testing.  Routine pelvic exams to screen for cancer are no longer recommended for nonpregnant women who are considered low risk for cancer of the pelvic organs (ovaries, uterus, and vagina) and who do not have symptoms. Ask your health care provider if a screening pelvic exam is right for you.  If you have had past treatment for cervical cancer or a condition that could lead to cancer, you need Pap tests and screening for cancer for at least 20 years after your treatment. If Pap tests have been discontinued, your risk factors (such as having a new sexual partner) need to be reassessed to determine if screening should be resumed. Some women have medical problems that increase the chance of getting cervical cancer. In these cases, your health care provider may recommend more frequent screening and Pap tests.  The HPV test is an additional test that may be used for cervical cancer screening. The HPV test looks for the virus that can cause the cell changes on the cervix. The cells collected during the Pap test can be  tested for HPV. The HPV test could be used to screen women aged 30 years and older, and should be used in women of any age who have unclear Pap test results. After the age of 30, women should have HPV testing at the same frequency as a Pap test.  Colorectal cancer can be detected and often prevented. Most routine colorectal cancer screening begins at the age of 50 years and continues through age 75 years. However, your health care provider may recommend screening at an earlier age if you have risk factors for colon cancer. On a yearly basis, your health care provider may provide home test kits to check for hidden blood in the stool. Use of a small camera at the end of a tube, to directly examine the colon (sigmoidoscopy or colonoscopy), can detect the earliest forms of colorectal cancer. Talk to your health care provider about this at age 50, when routine screening begins. Direct  exam of the colon should be repeated every 5-10 years through age 75 years, unless early forms of pre-cancerous polyps or small growths are found.  People who are at an increased risk for hepatitis B should be screened for this virus. You are considered at high risk for hepatitis B if:  You were born in a country where hepatitis B occurs often. Talk with your health care provider about which countries are considered high risk.  Your parents were born in a high-risk country and you have not received a shot to protect against hepatitis B (hepatitis B vaccine).  You have HIV or AIDS.  You use needles to inject street drugs.  You live with, or have sex with, someone who has hepatitis B.  You get hemodialysis treatment.  You take certain medicines for conditions like cancer, organ transplantation, and autoimmune conditions.  Hepatitis C blood testing is recommended for all people born from 1945 through 1965 and any individual with known risks for hepatitis C.  Practice safe sex. Use condoms and avoid high-risk sexual practices to reduce the spread of sexually transmitted infections (STIs). STIs include gonorrhea, chlamydia, syphilis, trichomonas, herpes, HPV, and human immunodeficiency virus (HIV). Herpes, HIV, and HPV are viral illnesses that have no cure. They can result in disability, cancer, and death.  You should be screened for sexually transmitted illnesses (STIs) including gonorrhea and chlamydia if:  You are sexually active and are younger than 24 years.  You are older than 24 years and your health care provider tells you that you are at risk for this type of infection.  Your sexual activity has changed since you were last screened and you are at an increased risk for chlamydia or gonorrhea. Ask your health care provider if you are at risk.  If you are at risk of being infected with HIV, it is recommended that you take a prescription medicine daily to prevent HIV infection. This is  called preexposure prophylaxis (PrEP). You are considered at risk if:  You are a heterosexual woman, are sexually active, and are at increased risk for HIV infection.  You take drugs by injection.  You are sexually active with a partner who has HIV.  Talk with your health care provider about whether you are at high risk of being infected with HIV. If you choose to begin PrEP, you should first be tested for HIV. You should then be tested every 3 months for as long as you are taking PrEP.  Osteoporosis is a disease in which the bones lose minerals and strength   with aging. This can result in serious bone fractures or breaks. The risk of osteoporosis can be identified using a bone density scan. Women ages 65 years and over and women at risk for fractures or osteoporosis should discuss screening with their health care providers. Ask your health care provider whether you should take a calcium supplement or vitamin D to reduce the rate of osteoporosis.  Menopause can be associated with physical symptoms and risks. Hormone replacement therapy is available to decrease symptoms and risks. You should talk to your health care provider about whether hormone replacement therapy is right for you.  Use sunscreen. Apply sunscreen liberally and repeatedly throughout the day. You should seek shade when your shadow is shorter than you. Protect yourself by wearing long sleeves, pants, a wide-brimmed hat, and sunglasses year round, whenever you are outdoors.  Once a month, do a whole body skin exam, using a mirror to look at the skin on your back. Tell your health care provider of new moles, moles that have irregular borders, moles that are larger than a pencil eraser, or moles that have changed in shape or color.  Stay current with required vaccines (immunizations).  Influenza vaccine. All adults should be immunized every year.  Tetanus, diphtheria, and acellular pertussis (Td, Tdap) vaccine. Pregnant women should  receive 1 dose of Tdap vaccine during each pregnancy. The dose should be obtained regardless of the length of time since the last dose. Immunization is preferred during the 27th-36th week of gestation. An adult who has not previously received Tdap or who does not know her vaccine status should receive 1 dose of Tdap. This initial dose should be followed by tetanus and diphtheria toxoids (Td) booster doses every 10 years. Adults with an unknown or incomplete history of completing a 3-dose immunization series with Td-containing vaccines should begin or complete a primary immunization series including a Tdap dose. Adults should receive a Td booster every 10 years.  Varicella vaccine. An adult without evidence of immunity to varicella should receive 2 doses or a second dose if she has previously received 1 dose. Pregnant females who do not have evidence of immunity should receive the first dose after pregnancy. This first dose should be obtained before leaving the health care facility. The second dose should be obtained 4-8 weeks after the first dose.  Human papillomavirus (HPV) vaccine. Females aged 13-26 years who have not received the vaccine previously should obtain the 3-dose series. The vaccine is not recommended for use in pregnant females. However, pregnancy testing is not needed before receiving a dose. If a female is found to be pregnant after receiving a dose, no treatment is needed. In that case, the remaining doses should be delayed until after the pregnancy. Immunization is recommended for any person with an immunocompromised condition through the age of 26 years if she did not get any or all doses earlier. During the 3-dose series, the second dose should be obtained 4-8 weeks after the first dose. The third dose should be obtained 24 weeks after the first dose and 16 weeks after the second dose.  Zoster vaccine. One dose is recommended for adults aged 60 years or older unless certain conditions are  present.  Measles, mumps, and rubella (MMR) vaccine. Adults born before 1957 generally are considered immune to measles and mumps. Adults born in 1957 or later should have 1 or more doses of MMR vaccine unless there is a contraindication to the vaccine or there is laboratory evidence of immunity to   each of the three diseases. A routine second dose of MMR vaccine should be obtained at least 28 days after the first dose for students attending postsecondary schools, health care workers, or international travelers. People who received inactivated measles vaccine or an unknown type of measles vaccine during 1963-1967 should receive 2 doses of MMR vaccine. People who received inactivated mumps vaccine or an unknown type of mumps vaccine before 1979 and are at high risk for mumps infection should consider immunization with 2 doses of MMR vaccine. For females of childbearing age, rubella immunity should be determined. If there is no evidence of immunity, females who are not pregnant should be vaccinated. If there is no evidence of immunity, females who are pregnant should delay immunization until after pregnancy. Unvaccinated health care workers born before 1957 who lack laboratory evidence of measles, mumps, or rubella immunity or laboratory confirmation of disease should consider measles and mumps immunization with 2 doses of MMR vaccine or rubella immunization with 1 dose of MMR vaccine.  Pneumococcal 13-valent conjugate (PCV13) vaccine. When indicated, a person who is uncertain of her immunization history and has no record of immunization should receive the PCV13 vaccine. An adult aged 19 years or older who has certain medical conditions and has not been previously immunized should receive 1 dose of PCV13 vaccine. This PCV13 should be followed with a dose of pneumococcal polysaccharide (PPSV23) vaccine. The PPSV23 vaccine dose should be obtained at least 8 weeks after the dose of PCV13 vaccine. An adult aged 19  years or older who has certain medical conditions and previously received 1 or more doses of PPSV23 vaccine should receive 1 dose of PCV13. The PCV13 vaccine dose should be obtained 1 or more years after the last PPSV23 vaccine dose.  Pneumococcal polysaccharide (PPSV23) vaccine. When PCV13 is also indicated, PCV13 should be obtained first. All adults aged 65 years and older should be immunized. An adult younger than age 65 years who has certain medical conditions should be immunized. Any person who resides in a nursing home or long-term care facility should be immunized. An adult smoker should be immunized. People with an immunocompromised condition and certain other conditions should receive both PCV13 and PPSV23 vaccines. People with human immunodeficiency virus (HIV) infection should be immunized as soon as possible after diagnosis. Immunization during chemotherapy or radiation therapy should be avoided. Routine use of PPSV23 vaccine is not recommended for American Indians, [place] Natives, or people younger than 65 years unless there are medical conditions that require PPSV23 vaccine. When indicated, people who have unknown immunization and have no record of immunization should receive PPSV23 vaccine. One-time revaccination 5 years after the first dose of PPSV23 is recommended for people aged 19-64 years who have chronic kidney failure, nephrotic syndrome, asplenia, or immunocompromised conditions. People who received 1-2 doses of PPSV23 before age 65 years should receive another dose of PPSV23 vaccine at age 65 years or later if at least 5 years have passed since the previous dose. Doses of PPSV23 are not needed for people immunized with PPSV23 at or after age 65 years.  Meningococcal vaccine. Adults with asplenia or persistent complement component deficiencies should receive 2 doses of quadrivalent meningococcal conjugate (MenACWY-D) vaccine. The doses should be obtained at least 2 months apart.  Microbiologists working with certain meningococcal bacteria, military recruits, people at risk during an outbreak, and people who travel to or live in countries with a high rate of meningitis should be immunized. A first-year college student up through age   21 years who is living in a residence hall should receive a dose if she did not receive a dose on or after her 16th birthday. Adults who have certain high-risk conditions should receive one or more doses of vaccine.  Hepatitis A vaccine. Adults who wish to be protected from this disease, have certain high-risk conditions, work with hepatitis A-infected animals, work in hepatitis A research labs, or travel to or work in countries with a high rate of hepatitis A should be immunized. Adults who were previously unvaccinated and who anticipate close contact with an international adoptee during the first 60 days after arrival in the [place] from a country with a high rate of hepatitis A should be immunized.  Hepatitis B vaccine. Adults who wish to be protected from this disease, have certain high-risk conditions, may be exposed to blood or other infectious body fluids, are household contacts or sex partners of hepatitis B positive people, are clients or workers in certain care facilities, or travel to or work in countries with a high rate of hepatitis B should be immunized.  Haemophilus influenzae type b (Hib) vaccine. A previously unvaccinated person with asplenia or sickle cell disease or having a scheduled splenectomy should receive 1 dose of Hib vaccine. Regardless of previous immunization, a recipient of a hematopoietic stem cell transplant should receive a 3-dose series 6-12 months after her successful transplant. Hib vaccine is not recommended for adults with HIV infection. Preventive Services / Frequency Ages 64 to 68 years  Blood pressure check.** / Every 1 to 2 years.  Lipid and cholesterol check.** / Every 5 years beginning at age  22.  Clinical breast exam.** / Every 3 years for women in their 88s and 53s.  BRCA-related cancer risk assessment.** / For women who have family members with a BRCA-related cancer (breast, ovarian, tubal, or peritoneal cancers).  Pap test.** / Every 2 years from ages 90 through 51. Every 3 years starting at age 21 through age 56 or 3 with a history of 3 consecutive normal Pap tests.  HPV screening.** / Every 3 years from ages 24 through ages 1 to 46 with a history of 3 consecutive normal Pap tests.  Hepatitis C blood test.** / For any individual with known risks for hepatitis C.  Skin self-exam. / Monthly.  Influenza vaccine. / Every year.  Tetanus, diphtheria, and acellular pertussis (Tdap, Td) vaccine.** / Consult your health care provider. Pregnant women should receive 1 dose of Tdap vaccine during each pregnancy. 1 dose of Td every 10 years.  Varicella vaccine.** / Consult your health care provider. Pregnant females who do not have evidence of immunity should receive the first dose after pregnancy.  HPV vaccine. / 3 doses over 6 months, if 72 and younger. The vaccine is not recommended for use in pregnant females. However, pregnancy testing is not needed before receiving a dose.  Measles, mumps, rubella (MMR) vaccine.** / You need at least 1 dose of MMR if you were born in 1957 or later. You may also need a 2nd dose. For females of childbearing age, rubella immunity should be determined. If there is no evidence of immunity, females who are not pregnant should be vaccinated. If there is no evidence of immunity, females who are pregnant should delay immunization until after pregnancy.  Pneumococcal 13-valent conjugate (PCV13) vaccine.** / Consult your health care provider.  Pneumococcal polysaccharide (PPSV23) vaccine.** / 1 to 2 doses if you smoke cigarettes or if you have certain conditions.  Meningococcal vaccine.** /  1 dose if you are age 19 to 21 years and a first-year college  student living in a residence hall, or have one of several medical conditions, you need to get vaccinated against meningococcal disease. You may also need additional booster doses.  Hepatitis A vaccine.** / Consult your health care provider.  Hepatitis B vaccine.** / Consult your health care provider.  Haemophilus influenzae type b (Hib) vaccine.** / Consult your health care provider. Ages 40 to 64 years  Blood pressure check.** / Every 1 to 2 years.  Lipid and cholesterol check.** / Every 5 years beginning at age 20 years.  Lung cancer screening. / Every year if you are aged 55-80 years and have a 30-pack-year history of smoking and currently smoke or have quit within the past 15 years. Yearly screening is stopped once you have quit smoking for at least 15 years or develop a health problem that would prevent you from having lung cancer treatment.  Clinical breast exam.** / Every year after age 40 years.  BRCA-related cancer risk assessment.** / For women who have family members with a BRCA-related cancer (breast, ovarian, tubal, or peritoneal cancers).  Mammogram.** / Every year beginning at age 40 years and continuing for as long as you are in good health. Consult with your health care provider.  Pap test.** / Every 3 years starting at age 30 years through age 65 or 70 years with a history of 3 consecutive normal Pap tests.  HPV screening.** / Every 3 years from ages 30 years through ages 65 to 70 years with a history of 3 consecutive normal Pap tests.  Fecal occult blood test (FOBT) of stool. / Every year beginning at age 50 years and continuing until age 75 years. You may not need to do this test if you get a colonoscopy every 10 years.  Flexible sigmoidoscopy or colonoscopy.** / Every 5 years for a flexible sigmoidoscopy or every 10 years for a colonoscopy beginning at age 50 years and continuing until age 75 years.  Hepatitis C blood test.** / For all people born from 1945 through  1965 and any individual with known risks for hepatitis C.  Skin self-exam. / Monthly.  Influenza vaccine. / Every year.  Tetanus, diphtheria, and acellular pertussis (Tdap/Td) vaccine.** / Consult your health care provider. Pregnant women should receive 1 dose of Tdap vaccine during each pregnancy. 1 dose of Td every 10 years.  Varicella vaccine.** / Consult your health care provider. Pregnant females who do not have evidence of immunity should receive the first dose after pregnancy.  Zoster vaccine.** / 1 dose for adults aged 60 years or older.  Measles, mumps, rubella (MMR) vaccine.** / You need at least 1 dose of MMR if you were born in 1957 or later. You may also need a 2nd dose. For females of childbearing age, rubella immunity should be determined. If there is no evidence of immunity, females who are not pregnant should be vaccinated. If there is no evidence of immunity, females who are pregnant should delay immunization until after pregnancy.  Pneumococcal 13-valent conjugate (PCV13) vaccine.** / Consult your health care provider.  Pneumococcal polysaccharide (PPSV23) vaccine.** / 1 to 2 doses if you smoke cigarettes or if you have certain conditions.  Meningococcal vaccine.** / Consult your health care provider.  Hepatitis A vaccine.** / Consult your health care provider.  Hepatitis B vaccine.** / Consult your health care provider.  Haemophilus influenzae type b (Hib) vaccine.** / Consult your health care provider. Ages 65   years and over  Blood pressure check.** / Every 1 to 2 years.  Lipid and cholesterol check.** / Every 5 years beginning at age 22 years.  Lung cancer screening. / Every year if you are aged 73-80 years and have a 30-pack-year history of smoking and currently smoke or have quit within the past 15 years. Yearly screening is stopped once you have quit smoking for at least 15 years or develop a health problem that would prevent you from having lung cancer  treatment.  Clinical breast exam.** / Every year after age 4 years.  BRCA-related cancer risk assessment.** / For women who have family members with a BRCA-related cancer (breast, ovarian, tubal, or peritoneal cancers).  Mammogram.** / Every year beginning at age 40 years and continuing for as long as you are in good health. Consult with your health care provider.  Pap test.** / Every 3 years starting at age 9 years through age 34 or 91 years with 3 consecutive normal Pap tests. Testing can be stopped between 65 and 70 years with 3 consecutive normal Pap tests and no abnormal Pap or HPV tests in the past 10 years.  HPV screening.** / Every 3 years from ages 57 years through ages 64 or 45 years with a history of 3 consecutive normal Pap tests. Testing can be stopped between 65 and 70 years with 3 consecutive normal Pap tests and no abnormal Pap or HPV tests in the past 10 years.  Fecal occult blood test (FOBT) of stool. / Every year beginning at age 15 years and continuing until age 17 years. You may not need to do this test if you get a colonoscopy every 10 years.  Flexible sigmoidoscopy or colonoscopy.** / Every 5 years for a flexible sigmoidoscopy or every 10 years for a colonoscopy beginning at age 86 years and continuing until age 71 years.  Hepatitis C blood test.** / For all people born from [birth year] through 1963 and any individual with known risks for hepatitis C.  Osteoporosis screening.** / A one-time screening for women ages 83 years and over and women at risk for fractures or osteoporosis.  Skin self-exam. / Monthly.  Influenza vaccine. / Every year.  Tetanus, diphtheria, and acellular pertussis (Tdap/Td) vaccine.** / 1 dose of Td every 10 years.  Varicella vaccine.** / Consult your health care provider.  Zoster vaccine.** / 1 dose for adults aged 61 years or older.  Pneumococcal 13-valent conjugate (PCV13) vaccine.** / Consult your health care provider.  Pneumococcal  polysaccharide (PPSV23) vaccine.** / 1 dose for all adults aged 28 years and older.  Meningococcal vaccine.** / Consult your health care provider.  Hepatitis A vaccine.** / Consult your health care provider.  Hepatitis B vaccine.** / Consult your health care provider.  Haemophilus influenzae type b (Hib) vaccine.** / Consult your health care provider. ** Family history and personal history of risk and conditions may change your health care provider's recommendations. Document Released: 03/24/2001 Document Revised: 06/12/2013 Document Reviewed: 06/23/2010 [facility] Patient Information 2015 [place], [place]. This information is not intended to replace advice given to you by your health care provider. Make sure you discuss any questions you have with your health care provider.

## 2014-07-19 NOTE — Addendum Note (Signed)
Addended by: Peggyann Shoals on: 07/19/2014 09:53 AM   Modules accepted: Orders

## 2014-07-19 NOTE — Progress Notes (Signed)
Subjective:   Kelly Elliott is a 74 y.o. female who presents for Medicare Annual (Subsequent) preventive examination.  Review of Systems:   Review of Systems  Constitutional: Negative for activity change, appetite change and fatigue.  HENT: Negative for hearing loss, congestion, tinnitus and ear discharge.   Eyes: Negative for visual disturbance (see optho q1y -- vision corrected to 20/20 with glasses). ---needs new doctor Respiratory: Negative for cough, chest tightness and shortness of breath.   Cardiovascular: Negative for chest pain, palpitations and leg swelling.  Gastrointestinal: Negative for abdominal pain, diarrhea, constipation and abdominal distention.  Genitourinary: Negative for urgency, frequency, decreased urine volume and difficulty urinating.  Musculoskeletal: worsening back pain with radiation and weakness in R leg Skin: Negative for color change, pallor and rash.  Neurological: Negative for dizziness, light-headedness, numbness and headaches.  Hematological: Negative for adenopathy. Does not bruise/bleed easily.  Psychiatric/Behavioral: Negative for suicidal ideas, confusion, sleep disturbance, self-injury, dysphoric mood, decreased concentration and agitation.  Pt is able to read and write and can do all ADLs No risk for falling No abuse/ violence in home          Objective:     Vitals: BP 128/84 mmHg  Pulse 80  Temp(Src) 97.7 F (36.5 C) (Oral)  Ht 5\' 4"  (1.626 m)  Wt 207 lb (93.895 kg)  BMI 35.51 kg/m2  SpO2 96% BP 128/84 mmHg  Pulse 80  Temp(Src) 97.7 F (36.5 C) (Oral)  Ht 5\' 4"  (1.626 m)  Wt 207 lb (93.895 kg)  BMI 35.51 kg/m2  SpO2 96% General appearance: alert, cooperative, appears stated age and no distress Head: Normocephalic, without obvious abnormality, atraumatic Eyes: negative findings: lids and lashes normal, conjunctivae and sclerae normal and pupils equal, round, reactive to light and accomodation Ears: normal TM's and external  ear canals both ears Nose: Nares normal. Septum midline. Mucosa normal. No drainage or sinus tenderness. Throat: lips, mucosa, and tongue normal; teeth and gums normal Neck: no adenopathy, no carotid bruit, no JVD, supple, symmetrical, trachea midline and thyroid not enlarged, symmetric, no tenderness/mass/nodules Back: symmetric, no curvature. ROM normal. No CVA tenderness. Lungs: clear to auscultation bilaterally Breasts: normal appearance, no masses or tenderness Heart: regular rate and rhythm, S1, S2 normal, no murmur, click, rub or gallop Abdomen: soft, non-tender; bowel sounds normal; no masses,  no organomegaly Pelvic: not indicated; post-menopausal, no abnormal Pap smears in past Extremities: extremities normal, atraumatic, no cyanosis or edema Pulses: 2+ and symmetric Skin: Skin color, texture, turgor normal. No rashes or lesions Lymph nodes: Cervical, supraclavicular, and axillary nodes normal. Neurologic: Alert and oriented X 3, normal strength and tone. Normal symmetric reflexes. Normal coordination and gait PSYCH- NO DEPRESSION, NO ANXIETY Tobacco History  Smoking status  . Never Smoker   Smokeless tobacco  . Never Used     Counseling given: Not Answered   Past Medical History  Diagnosis Date  . Anxiety   . Diabetes mellitus   . Hypercholesterolemia   . Hypertension   . Hypothyroidism   . Lumbar pain   . Herniated disc   . Arthritis   . Hemorrhoids    Past Surgical History  Procedure Laterality Date  . Leg surgery  05/2010    Fibula surgery  . Cataract extraction, bilateral    . Tonsillectomy      age 65  . Partial knee arthroplasty Left 01/29/2014    Procedure: UNICOMPARTMENTAL KNEE LEFT ARTHOPLASTY;  Surgeon: Mauri Pole, MD;  Location: [facility];  Service: Orthopedics;  Laterality: Left;   Family History  Problem Relation Age of Onset  . Diabetes Mother   . Hyperlipidemia Father   . Hypertension Father   . Stroke Father 44    quadruple bypass  .  Heart disease Father     quadruple bypass  . Diabetes Brother   . Hypertension Brother   . Hyperlipidemia Brother   . Colon cancer Neg Hx   . Rectal cancer Neg Hx   . Stomach cancer Neg Hx   . Esophageal cancer Neg Hx    History  Sexual Activity  . Sexual Activity:  . Partners: Male    Outpatient Encounter Prescriptions as of 07/19/2014  Medication Sig  . acetaminophen (TYLENOL ARTHRITIS PAIN) 650 MG CR tablet Take 650 mg by mouth every 8 (eight) hours as needed for pain.  Marland Kitchen ALPRAZolam (XANAX XR) 1 MG 24 hr tablet Take 1 mg by mouth as needed for anxiety.  . busPIRone (BUSPAR) 30 MG tablet Take 1 tablet (30 mg total) by mouth 2 (two) times daily.  . Cholecalciferol (VITAMIN D) 2000 UNITS CAPS Take 1 capsule by mouth every morning.  . docusate sodium 100 MG CAPS Take 100 mg by mouth 2 (two) times daily.  . ferrous sulfate 325 (65 FE) MG tablet Take 1 tablet (325 mg total) by mouth 3 (three) times daily after meals.  Marland Kitchen glipiZIDE (GLUCOTROL XL) 2.5 MG 24 hr tablet TAKE ONE (1) TABLET BY MOUTH EVERY DAY  . glucose blood (ONETOUCH VERIO) test strip CHECK BLOOD SUGAR ONCE DAILY (Patient taking differently: 1 each by Other route every morning. )  . levothyroxine (SYNTHROID, LEVOTHROID) 88 MCG tablet TAKE ONE (1) TABLET BY MOUTH EVERY DAY  . lisinopril (PRINIVIL,ZESTRIL) 20 MG tablet TAKE TWO (2) TABLETS BY MOUTH EVERY MORNING AND TAKE ONE TABLET BY MOUTH EVERY EVENING  . LORazepam (ATIVAN) 1 MG tablet TAKE ONE TABLET BY MOUTH AS NEEDED  . Multiple Vitamins-Minerals (CENTRUM SILVER ULTRA WOMENS) TABS Take 1 tablet by mouth every morning.   Glory Rosebush DELICA LANCETS FINE MISC 1 Device by Does not apply route daily. CHECK BLOOD SUGAR ONCE DAILY (Patient taking differently: 1 Device by Does not apply route every morning. )  . polyethylene glycol (MIRALAX / GLYCOLAX) packet Take 17 g by mouth 2 (two) times daily.  Marland Kitchen triamcinolone ointment (KENALOG) 0.1 % Apply 1 application topically daily as  needed (eczema).  . zolpidem (AMBIEN) 5 MG tablet TAKE ONE (1) TABLET BY MOUTH EVERY THIRDNIGHT AT BEDTIME AS NEEDED.  . [DISCONTINUED] atorvastatin (LIPITOR) 40 MG tablet Take 1 tablet (40 mg total) by mouth daily. (Patient not taking: Reported on 07/18/2014)  . [DISCONTINUED] tiZANidine (ZANAFLEX) 4 MG tablet Take 1 tablet (4 mg total) by mouth every 6 (six) hours as needed for muscle spasms. (Patient not taking: Reported on 07/18/2014)   No facility-administered encounter medications on file as of 07/19/2014.    Activities of Daily Living In your present state of health, do you have any difficulty performing the following activities: 01/29/2014 01/29/2014  Hearing? N -  Vision? N -  Difficulty concentrating or making decisions? N -  Walking or climbing stairs? Y -  Dressing or bathing? N -  Doing errands, shopping? - Y    Patient Care Team: Rosalita Chessman, DO as PCP - General (Family Medicine) Lanae Crumbly, PA-C as Physician Assistant (Physician Assistant)    Assessment:    See avs Check labs Exercise Activities and Dietary recommendations--- not able  to diet secondary to knee and back pain    Goals    None     Fall Risk Fall Risk  12/25/2013 04/13/2012 04/16/2011  Falls in the past year? No No -  Risk for fall due to : - - History of fall(s)   Depression Screen PHQ 2/9 Scores 12/25/2013 04/13/2012 04/16/2011  PHQ - 2 Score 1 1 0     Cognitive Testing No depression, anxiety, AAOx3  Immunization History  Administered Date(s) Administered  . Influenza Split 11/17/2010, 10/11/2011  . Influenza, High Dose Seasonal PF 11/28/2012  . Influenza-Unspecified 10/19/2013  . Pneumococcal Conjugate-13 12/25/2013  . Pneumococcal Polysaccharide-23 04/13/2012   Screening Tests Health Maintenance  Topic Date Due  . TETANUS/TDAP  10/04/1961  . OPHTHALMOLOGY EXAM  09/01/2012  . FOOT EXAM  04/13/2013  . URINE MICROALBUMIN  04/13/2013  . MAMMOGRAM  07/10/2013  . HEMOGLOBIN A1C   12/10/2013  . INFLUENZA VACCINE  09/10/2014  . COLONOSCOPY  07/21/2016  . DEXA SCAN  Completed  . ZOSTAVAX  Addressed  . PNA vac Low Risk Adult  Completed      Plan:    check labs  During the course of the visit the patient was educated and counseled about the following appropriate screening and preventive services:   Vaccines to include Pneumoccal, Influenza, Hepatitis B, Td, Zostavax, HCV  Electrocardiogram  Cardiovascular Disease  Colorectal cancer screening  Bone density screening  Diabetes screening  Glaucoma screening  Mammography/PAP  Nutrition counseling   Patient Instructions (the written plan) was given to the patient.  1. Need for prophylactic vaccination with combined diphtheria-tetanus-pertussis (DTP) vaccine  - Tdap (ADACEL) 06-10-13.5 LF-MCG/0.5 injection; Inject 0.5 mLs into the muscle once.  Dispense: 0.5 mL; Refill: 0  2. Diabetes mellitus type II, controlled Check labs con't glipizide - Basic metabolic panel - POCT Urinalysis Dipstick - HgB A1c - POCT urinalysis dipstick - Microalbumin / creatinine urine ratio  3. Essential hypertension con't lisinopril  - CBC with Differential/Platelet - Hepatic function panel - POCT Urinalysis Dipstick - POCT urinalysis dipstick - Microalbumin / creatinine urine ratio  4. Hypothyroidism-- check tsh, con't synthroid  5. Right-sided low back pain with right-sided sciatica Worsening--- check MRI  - HYDROcodone-acetaminophen (NORCO/VICODIN) 5-325 MG per tablet; Take 1 tablet by mouth every 6 (six) hours as needed for moderate pain.  Dispense: 30 tablet; Refill: 0 - MR Lumbar Spine Wo Contrast; Future  6. Medicare annual wellness visit, subsequent See avs   7. Idiopathic gout of foot, unspecified chronicity, unspecified laterality  - Uric acid  Garnet Koyanagi, DO  07/19/2014

## 2014-07-23 ENCOUNTER — Other Ambulatory Visit: Payer: Self-pay | Admitting: Family Medicine

## 2014-07-23 ENCOUNTER — Telehealth: Payer: Self-pay | Admitting: Family Medicine

## 2014-07-23 DIAGNOSIS — M5440 Lumbago with sciatica, unspecified side: Secondary | ICD-10-CM

## 2014-07-23 MED ORDER — LEVOTHYROXINE SODIUM 100 MCG PO TABS: 100.0000 ug | ORAL_TABLET | Freq: Every day | ORAL | Status: DC

## 2014-07-23 MED ORDER — OXYCODONE-ACETAMINOPHEN 5-325 MG PO TABS: 1.0000 | ORAL_TABLET | Freq: Three times a day (TID) | ORAL | Status: DC | PRN

## 2014-07-23 MED ORDER — SYNTHROID 100 MCG PO TABS: 100.0000 ug | ORAL_TABLET | Freq: Every day | ORAL | Status: DC

## 2014-07-23 NOTE — Telephone Encounter (Signed)
She can take 2 -----  When is mri ?

## 2014-07-23 NOTE — Telephone Encounter (Signed)
Please review and advise     KP 

## 2014-07-23 NOTE — Telephone Encounter (Signed)
Kelly Chessman, DO  Ewing Schlein, CMA  Also sodium is low--- may be from thyroid  Recheck 3 months   Notes Recorded by Kelly Chessman, DO on 07/21/2014 at 6:02 PM Synthroid 124mcg #30 1po qd 2 refills-- she is hypothyroid  Cholesterol--- LDL goal < 70, HDL >40, TG < 150. Diet and exercise will increase HDL and decrease LDL and TG. Fish, Fish Oil, Flaxseed oil will also help increase the HDL and decrease Triglycerides.  Recheck labs in 6 months--- con't to diet and exercise Labs need to be rechecked in 6 months Lipid, hep, bmp, hgba1c.    Spoke with patient and she verbalized understanding, the MRI is scheduled for Saturday, and she has agreed to take the Synthroid 100 mcg. She stated the Hydrocodone has not worked in the past and she is requesting Oxycodone. Please advise    KP

## 2014-07-23 NOTE — Telephone Encounter (Signed)
Caller name: Quinlyn Tep Relation to pt: Self Call back number: [phone] Pharmacy:  Reason for call: Pt states saw Dr. Etter Sjogren on Thursday for Cpe and was experiencing back pain and was rx Hydrocodone, pt states rx is not working at all and still experiencing severe pain constantly. Pt is wanting to know if can have another type of med or what can pt do. Please advise.

## 2014-07-25 ENCOUNTER — Telehealth: Payer: Self-pay | Admitting: Family Medicine

## 2014-07-25 NOTE — Telephone Encounter (Signed)
Patient states that [facility] will pick up rx.

## 2014-07-28 ENCOUNTER — Ambulatory Visit (HOSPITAL_BASED_OUTPATIENT_CLINIC_OR_DEPARTMENT_OTHER)
Admission: RE | Admit: 2014-07-28 | Discharge: 2014-07-28 | Disposition: A | Discharge status: Home / Self Care | Payer: Medicare Other | Source: Ambulatory Visit | Attending: Family Medicine | Admitting: Family Medicine

## 2014-07-28 DIAGNOSIS — M5126 Other intervertebral disc displacement, lumbar region: Secondary | ICD-10-CM | POA: Insufficient documentation

## 2014-07-28 DIAGNOSIS — M545 Low back pain: Secondary | ICD-10-CM | POA: Diagnosis present

## 2014-07-28 DIAGNOSIS — M5441 Lumbago with sciatica, right side: Secondary | ICD-10-CM

## 2014-07-30 ENCOUNTER — Telehealth: Payer: Self-pay | Admitting: Family Medicine

## 2014-07-30 ENCOUNTER — Other Ambulatory Visit: Payer: Self-pay | Admitting: Family Medicine

## 2014-07-30 DIAGNOSIS — M5440 Lumbago with sciatica, unspecified side: Secondary | ICD-10-CM

## 2014-07-30 MED ORDER — OXYCODONE-ACETAMINOPHEN 5-325 MG PO TABS: ORAL_TABLET | ORAL | Status: DC

## 2014-07-30 NOTE — Telephone Encounter (Signed)
Caller name: Kelly Elliott Relationship to patient: self Can be reached: [phone] Pharmacy:  Reason for call: Pt calling for refill on oxycodone. I asked if she had used 30 doses since 07/23/14. She said it must not be working very well. She states she is taking every 4 hours. I advised that RX was written 07/23/14 for every 8 hours. Pt said that when she wakes up she is in pain and takes another one. She states 12 left and she doesn't want to run out. She states she can't come get it but a friend can pick up. [facility] can pick up for her if refilled.

## 2014-07-30 NOTE — Telephone Encounter (Signed)
Patient aware Rx ready for pick up.      KP 

## 2014-07-30 NOTE — Telephone Encounter (Signed)
Last seen 07/19/14 and filled 07/23/14 #30 No UDS  Please advise     KP

## 2014-07-30 NOTE — Telephone Encounter (Signed)
Percocet 1-2 po q4h prn pain #60 printed

## 2014-07-31 ENCOUNTER — Telehealth: Payer: Self-pay | Admitting: Family Medicine

## 2014-07-31 NOTE — Telephone Encounter (Signed)
Relation to pt: self Call back number: [phone]   Reason for call:   Pt arranged someone to pick up scripts that were left at the front desk today and pt is requesting if MRI results taken on 07/28/14 as well as MRI/herniated disc results and office notes with  Dr. Latanya Maudlin be left at the front desk with scripts.

## 2014-07-31 NOTE — Telephone Encounter (Signed)
Left at check in and the patient is aware.     KP

## 2014-08-15 ENCOUNTER — Telehealth: Payer: Self-pay | Admitting: Family Medicine

## 2014-08-15 DIAGNOSIS — M5440 Lumbago with sciatica, unspecified side: Secondary | ICD-10-CM

## 2014-08-15 NOTE — Telephone Encounter (Signed)
Caller name: Karagan Relation to pt: Call back number: [phone] Pharmacy:  Reason for call:   Patient is requesting oxycodone refill

## 2014-08-16 NOTE — Telephone Encounter (Signed)
If pt has seen ortho she should be getting pain meds from them now

## 2014-08-16 NOTE — Telephone Encounter (Signed)
Msg left to call the office     KP 

## 2014-08-16 NOTE — Telephone Encounter (Signed)
Last seen 06/04/14 and filled 07/30/14 #60 NO UDS   Please advise      KP

## 2014-08-17 MED ORDER — OXYCODONE-ACETAMINOPHEN 5-325 MG PO TABS: ORAL_TABLET | ORAL | Status: DC

## 2014-08-17 NOTE — Telephone Encounter (Signed)
Refill x1 

## 2014-08-17 NOTE — Telephone Encounter (Signed)
Patient said she has not seen Ortho yet, her apt is Monday but she is out of med's and did not sleep last night due to the pain. Please advise     KP

## 2014-08-17 NOTE — Telephone Encounter (Signed)
Patient returned phone call. Best # [phone]

## 2014-08-17 NOTE — Telephone Encounter (Signed)
Patient has been made aware that the medication is ready for pick up.     KP

## 2014-08-22 ENCOUNTER — Other Ambulatory Visit: Payer: Self-pay | Admitting: Orthopedic Surgery

## 2014-08-22 DIAGNOSIS — M5126 Other intervertebral disc displacement, lumbar region: Secondary | ICD-10-CM

## 2014-09-05 ENCOUNTER — Other Ambulatory Visit: Payer: Self-pay | Admitting: Family Medicine

## 2014-09-25 ENCOUNTER — Other Ambulatory Visit: Payer: Self-pay | Admitting: Family Medicine

## 2014-09-25 ENCOUNTER — Telehealth: Payer: Self-pay | Admitting: Family Medicine

## 2014-09-25 NOTE — Telephone Encounter (Signed)
To MD to review.     KP 

## 2014-09-25 NOTE — Telephone Encounter (Signed)
Caller name: Relation to OD:QVHQ Call back number [phone] Pharmacy:  Reason for call: pt wants to know if dr.lowne received a copy of her podiatry test that was done on March 2013, states they were going to fax it again today, pt wants to make sure dr. Etter Sjogren has received it yet.

## 2014-09-25 NOTE — Telephone Encounter (Signed)
Vm left advising paperwork received and if there is something she would like for [name] to do with it, to call back.    KP

## 2014-09-25 NOTE — Telephone Encounter (Signed)
I have it.

## 2014-10-11 ENCOUNTER — Other Ambulatory Visit: Payer: Self-pay | Admitting: Family Medicine

## 2014-10-11 NOTE — Telephone Encounter (Signed)
Last seen 07/19/14 and filled 07/12/14 #30 with 1 refill.    Please advise    KP

## 2014-11-07 ENCOUNTER — Other Ambulatory Visit: Payer: Self-pay | Admitting: Family Medicine

## 2014-12-05 ENCOUNTER — Ambulatory Visit: Payer: Medicare Other | Admitting: Neurology

## 2014-12-28 ENCOUNTER — Ambulatory Visit (INDEPENDENT_AMBULATORY_CARE_PROVIDER_SITE_OTHER): Payer: Medicare Other | Admitting: Neurology

## 2014-12-28 ENCOUNTER — Encounter: Payer: Self-pay | Admitting: Neurology

## 2014-12-28 VITALS — BP 150/90 | HR 69 | Ht 64.0 in | Wt 187.1 lb

## 2014-12-28 DIAGNOSIS — R030 Elevated blood-pressure reading, without diagnosis of hypertension: Secondary | ICD-10-CM

## 2014-12-28 DIAGNOSIS — IMO0001 Reserved for inherently not codable concepts without codable children: Secondary | ICD-10-CM

## 2014-12-28 DIAGNOSIS — M5416 Radiculopathy, lumbar region: Secondary | ICD-10-CM | POA: Diagnosis not present

## 2014-12-28 DIAGNOSIS — R29898 Other symptoms and signs involving the musculoskeletal system: Secondary | ICD-10-CM

## 2014-12-28 DIAGNOSIS — M7501 Adhesive capsulitis of right shoulder: Secondary | ICD-10-CM

## 2014-12-28 MED ORDER — GABAPENTIN 300 MG PO CAPS: ORAL_CAPSULE | ORAL | Status: DC

## 2014-12-28 NOTE — Patient Instructions (Signed)
1.  Start gabapentin 300mg  one tablet at bedtime for one week, then increase to one tablet twice daily 2.  Start out-patient physical therapy for leg strengthening 3.  Return to clinic 2-3 months

## 2014-12-28 NOTE — Progress Notes (Signed)
[facility] Clinic Note - Initial Visit   Date: 12/28/2014  Kelly Elliott MRN: [id number] DOB: November 06, 1940   Dear Dr. Emiliano Dyer:  Thank you for your kind referral of Kelly Elliott for consultation of right knee and leg pain. Although her history is well known to you, please allow [name] to reiterate it for the purpose of our medical record. The patient was accompanied to the clinic by wife who also provides collateral information.     History of Present Illness: Kelly Elliott is a 74 y.o. left-handed Caucasian female with diabetes mellitus, hypertension, hypothyroidism, anxiety, and hyperlipidemia presenting for evaluation of right knee and leg pain.    She underwent left knee arthroscopy in December 2015 and since then she noticed right leg weakness, because of buckling sensation.  She feels that her quadriceps muscles are weak and she has sharp pain over the same area.  She applies a cold pack over the thigh which alleviates the pain in the thigh.  She had episodic sharp pain involving the right lower leg, lasting a few seconds.  She also complains of numbness and tingling over the thigh and knee.   Over the past year, she has fallen three times because of her right leg buckling.  She has sharp stabbing pain over the right knee, which is worse at night, and occurs several times throughout the night.  Nothing improves or exacerbates the pain.  She has not done PT for her right leg.  She walks with a cane for support.    She has tried hydrocodone, tylenol, ibuprofen, and naproxsyn.    Out-side paper records, electronic medical record, and images have been reviewed where available and summarized as:  MRI lumbar spine wo contrast 07/28/2014 No change in the appearance lumbar spine. Small foraminal extra foraminal protrusion on the left at L4-5 causes mild foraminal narrowing. No nerve root compression is identified.  Lab Results  Component Value Date   HGBA1C 6.0  07/19/2014   Lab Results  Component Value Date   TSH 8.03* 07/19/2014     Past Medical History  Diagnosis Date  . Anxiety   . Diabetes mellitus   . Hypercholesterolemia   . Hypertension   . Hypothyroidism   . Lumbar pain   . Herniated disc   . Arthritis   . Hemorrhoids     Past Surgical History  Procedure Laterality Date  . Leg surgery  05/2010    Fibula surgery  . Cataract extraction, bilateral    . Tonsillectomy      age 26  . Partial knee arthroplasty Left 01/29/2014    Procedure: UNICOMPARTMENTAL KNEE LEFT ARTHOPLASTY;  Surgeon: Mauri Pole, MD;  Location: [facility];  Service: Orthopedics;  Laterality: Left;     Medications:  Outpatient Encounter Prescriptions as of 12/28/2014  Medication Sig  . acetaminophen (TYLENOL ARTHRITIS PAIN) 650 MG CR tablet Take 650 mg by mouth every 8 (eight) hours as needed for pain.  . busPIRone (BUSPAR) 30 MG tablet TAKE ONE (1) TABLET BY MOUTH TWO (2) TIMES DAILY  . Cholecalciferol (VITAMIN D) 2000 UNITS CAPS Take 1 capsule by mouth every morning.  . docusate sodium 100 MG CAPS Take 100 mg by mouth 2 (two) times daily.  . ferrous sulfate 325 (65 FE) MG tablet Take 1 tablet (325 mg total) by mouth 3 (three) times daily after meals.  Marland Kitchen glipiZIDE (GLUCOTROL XL) 2.5 MG 24 hr tablet TAKE ONE (1) TABLET BY MOUTH EVERY DAY  .  glucose blood (ONETOUCH VERIO) test strip CHECK BLOOD SUGAR ONCE DAILY (Patient taking differently: 1 each by Other route every morning. )  . HYDROcodone-acetaminophen (NORCO/VICODIN) 5-325 MG per tablet Take 1 tablet by mouth every 6 (six) hours as needed for moderate pain.  Marland Kitchen levothyroxine (SYNTHROID, LEVOTHROID) 100 MCG tablet TAKE ONE (1) TABLET BY MOUTH EVERY DAY BEFORE BREAKFAST  . lisinopril (PRINIVIL,ZESTRIL) 20 MG tablet TAKE TWO (2) TABLETS BY MOUTH EVERY MORNING AND TAKE ONE TABLET BY MOUTH EVERY EVENING  . Multiple Vitamins-Minerals (CENTRUM SILVER ULTRA WOMENS) TABS Take 1 tablet by mouth every morning.   Glory Rosebush DELICA LANCETS FINE MISC 1 Device by Does not apply route daily. CHECK BLOOD SUGAR ONCE DAILY (Patient taking differently: 1 Device by Does not apply route every morning. )  . oxyCODONE-acetaminophen (ROXICET) 5-325 MG per tablet 1-2 po q4h prn severe pain  . polyethylene glycol (MIRALAX / GLYCOLAX) packet Take 17 g by mouth 2 (two) times daily.  . Tdap (ADACEL) 06-10-13.5 LF-MCG/0.5 injection Inject 0.5 mLs into the muscle once.  . triamcinolone ointment (KENALOG) 0.1 % Apply 1 application topically daily as needed (eczema).  . [DISCONTINUED] ALPRAZolam (XANAX XR) 1 MG 24 hr tablet Take 1 mg by mouth as needed for anxiety.  . [DISCONTINUED] LORazepam (ATIVAN) 1 MG tablet TAKE ONE TABLET BY MOUTH AS NEEDED  . [DISCONTINUED] SYNTHROID 100 MCG tablet Take 1 tablet (100 mcg total) by mouth daily before breakfast.  . [DISCONTINUED] zolpidem (AMBIEN) 5 MG tablet TAKE ONE (1) TABLET BY MOUTH EVERY THIRDNIGHT AT BEDTIME AS NEEDED.  Marland Kitchen gabapentin (NEURONTIN) 300 MG capsule Take 1 tablet at bedtime for one week, then increase to one tablet twice daily   No facility-administered encounter medications on file as of 12/28/2014.     Allergies:  Allergies  Allergen Reactions  . Corticosteroids Other (See Comments)    Severe pain  . Fluorescein   . Menthol     Family History: Family History  Problem Relation Age of Onset  . Diabetes Mother   . Hyperlipidemia Father   . Hypertension Father   . Stroke Father 84    quadruple bypass  . Heart disease Father     quadruple bypass  . Diabetes Brother   . Hypertension Brother   . Hyperlipidemia Brother   . Colon cancer Neg Hx   . Rectal cancer Neg Hx   . Stomach cancer Neg Hx   . Esophageal cancer Neg Hx     Social History: Social History  Substance Use Topics  . Smoking status: Never Smoker   . Smokeless tobacco: Never Used  . Alcohol Use: No   Social History   Social History Narrative   She lives alone. She had two grown  children.    Retired Pharmacist, hospital.   Exercise--no       Review of Systems:  CONSTITUTIONAL: No fevers, chills, night sweats, or weight loss.   EYES: No visual changes or eye pain ENT: No hearing changes.  No history of nose bleeds.   RESPIRATORY: No cough, wheezing and shortness of breath.   CARDIOVASCULAR: Negative for chest pain, and palpitations.   GI: Negative for abdominal discomfort, blood in stools or black stools.  No recent change in bowel habits.   GU:  No history of incontinence.   MUSCLOSKELETAL: +history of joint pain or swelling.  +myalgias.   SKIN: Negative for lesions, rash, and itching.   HEMATOLOGY/ONCOLOGY: Negative for prolonged bleeding, bruising easily, and swollen nodes.  No history  of cancer.   ENDOCRINE: Negative for cold or heat intolerance, polydipsia or goiter.   PSYCH:  +depression +anxiety symptoms.   NEURO: As Above.   Vital Signs:  BP 150/90 mmHg  Pulse 69  Ht 5\' 4"  (1.626 m)  Wt 187 lb 2 oz (84.879 kg)  BMI 32.10 kg/m2  SpO2 97%   General Medical Exam:   General:  Anxious appearing, comfortable.   Eyes/ENT: see cranial nerve examination.   Neck: No masses appreciated.  Full range of motion without tenderness.  No carotid bruits. Respiratory:  Clear to auscultation, good air entry bilaterally.   Cardiac:  Regular rate and rhythm, no murmur.   Extremities:  No deformities, edema, or skin discoloration.  Skin:  No rashes or lesions.  Neurological Exam: MENTAL STATUS including orientation to time, place, person, recent and remote memory, attention span and concentration, language, and fund of knowledge is normal.  Speech is not dysarthric.  CRANIAL NERVES: II:  No visual field defects.  Unremarkable fundi.   III-IV-VI: Pupils equal round and reactive to light.  Normal conjugate, extra-ocular eye movements in all directions of gaze.  No nystagmus.  No ptosis. V:  Normal facial sensation.  VII:  Normal facial symmetry and movements.    VIII:   Normal hearing and vestibular function.   IX-X:  Normal palatal movement.   XI:  Normal shoulder shrug and head rotation.   XII:  Normal tongue strength and range of motion, no deviation or fasciculation.  MOTOR:  No atrophy, fasciculations or abnormal movements.  No pronator drift.  Tone is normal.    Right Upper Extremity:    Left Upper Extremity:    Deltoid  5/5   Deltoid  5/5   Biceps  5/5   Biceps  5/5   Triceps  5/5   Triceps  5/5   Wrist extensors  5/5   Wrist extensors  5/5   Wrist flexors  5/5   Wrist flexors  5/5   Finger extensors  5/5   Finger extensors  5/5   Finger flexors  5/5   Finger flexors  5/5   Dorsal interossei  5/5   Dorsal interossei  5/5   Abductor pollicis  5/5   Abductor pollicis  5/5   Tone (Ashworth scale)  0  Tone (Ashworth scale)  0   Right Lower Extremity:    Left Lower Extremity:    Hip flexors  4/5   Hip flexors  5/5   Adductor 4/5  Adductor 5/5  Abductor 5/5  Abductor 5/5  Hip extensors  5/5   Hip extensors  5/5   Knee flexors  5/5   Knee flexors  5/5   Knee extensors  5/5   Knee extensors  5/5   Dorsiflexors  5/5   Dorsiflexors  5/5   Plantarflexors  5/5   Plantarflexors  5/5   Toe extensors  5/5   Toe extensors  5/5   Toe flexors  5/5   Toe flexors  5/5   Tone (Ashworth scale)  0  Tone (Ashworth scale)  0   MSRs:  Right                                                                 Left  brachioradialis 2+  brachioradialis 2+  biceps 2+  biceps 2+  triceps 2+  triceps 2+  patellar 1+  patellar 2+  ankle jerk 2+  ankle jerk 2+  Hoffman no  Hoffman no  plantar response down  plantar response down   SENSORY:  Normal and symmetric perception of light touch, pinprick, vibration, and proprioception.  Romberg's sign absent.   COORDINATION/GAIT: Normal finger-to- nose-finger and heel-to-shin.  Intact rapid alternating movements bilaterally.  Unable to rise from a chair without using arms.  Gait is slow and cautious.   IMPRESSION: Ms. Venturo is  a 74 year-old female referred for evaluation of right leg weakness and pain.  Her exam shows right hip flexion and adductor weakness, along with reduced patella reflex on this side suggestive of L3-4 radiculopathy, although this was not apparent on her MRI lumbar spine.  I offered to perform NCS/EMG of the right leg, but she did not tolerate this previously and does not wish to have this repeated at this time.  Otherwise, I offered conservative therapy with PT and management of pain with gabapentin, which she agreed to.   Of note, there is reduced right shoulder range of motion, especially with shoulder abduction and extension.  Her motor strength is intact.  She reports having neonatal injury during delivery which left her with some proximal arm weakness (?Erb's palsy), but since a fall several years ago, the range of motion has worsened, ?adhesive capsulitis.  Her blood pressure was very elevated initially (200/104), she was clinically stable but extremely anxious.  Blood pressure rechecked at the end of the visit was 150/90.   PLAN/RECOMMENDATIONS:  1.  Start physical therapy for right leg strengthening 2.  Start gabapentin 300mg  at bedtime for one week, then increase to one tablet twice daily 3.  If symptoms worsen or do not improve, recommend NCS/EMG of the right leg  Return to clinic in 2-3 months.   The duration of this appointment visit was 40 minutes of face-to-face time with the patient.  Greater than 50% of this time was spent in counseling, explanation of diagnosis, planning of further management, and coordination of care.   Thank you for allowing me to participate in patient's care.  If I can answer any additional questions, I would be pleased to do so.    Sincerely,    Donika K. Posey Pronto, DO

## 2014-12-28 NOTE — Progress Notes (Signed)
Note routed

## 2015-02-12 ENCOUNTER — Telehealth: Payer: Self-pay | Admitting: Neurology

## 2015-02-12 NOTE — Telephone Encounter (Signed)
I spoke with patient and she said that she had not heard back from [facility] PT.  Informed he that I will send a referral to [facility].

## 2015-02-12 NOTE — Telephone Encounter (Signed)
VM-PT left message in regards to physical therapy/[name] CB# [phone]

## 2015-03-04 ENCOUNTER — Telehealth: Payer: Self-pay | Admitting: Family Medicine

## 2015-03-04 MED ORDER — GLIMEPIRIDE 2 MG PO TABS: 2.0000 mg | ORAL_TABLET | Freq: Every day | ORAL | Status: DC

## 2015-03-04 NOTE — Telephone Encounter (Signed)
Rx faxed and the patient has been made aware.     KP 

## 2015-03-04 NOTE — Telephone Encounter (Signed)
[facility], [place] [phone] (Phone) [phone] (Fax)         Reason for call:  As per pharmacy glipiZIDE (GLUCOTROL XL) 2.5 MG 24 hr tablet is on back order requesting a alternate

## 2015-03-04 NOTE — Telephone Encounter (Signed)
amaryl 2 mg #30  1 po qd , 2 refills--- recheck 3 months

## 2015-03-04 NOTE — Telephone Encounter (Signed)
Please advise      KP 

## 2015-03-15 ENCOUNTER — Telehealth: Payer: Self-pay | Admitting: Neurology

## 2015-03-15 ENCOUNTER — Ambulatory Visit
Admission: RE | Admit: 2015-03-15 | Discharge: 2015-03-15 | Disposition: A | Discharge status: Home / Self Care | Payer: Medicare Other | Source: Ambulatory Visit | Attending: Neurology | Admitting: Neurology

## 2015-03-15 ENCOUNTER — Other Ambulatory Visit: Payer: Self-pay

## 2015-03-15 ENCOUNTER — Encounter: Payer: Self-pay | Admitting: Neurology

## 2015-03-15 ENCOUNTER — Ambulatory Visit (INDEPENDENT_AMBULATORY_CARE_PROVIDER_SITE_OTHER): Payer: Medicare Other | Admitting: Neurology

## 2015-03-15 ENCOUNTER — Other Ambulatory Visit (INDEPENDENT_AMBULATORY_CARE_PROVIDER_SITE_OTHER): Payer: Medicare Other

## 2015-03-15 VITALS — BP 194/90 | HR 74 | Wt 186.4 lb

## 2015-03-15 DIAGNOSIS — I679 Cerebrovascular disease, unspecified: Secondary | ICD-10-CM

## 2015-03-15 DIAGNOSIS — I6320 Cerebral infarction due to unspecified occlusion or stenosis of unspecified precerebral arteries: Secondary | ICD-10-CM

## 2015-03-15 DIAGNOSIS — G255 Other chorea: Secondary | ICD-10-CM

## 2015-03-15 DIAGNOSIS — I639 Cerebral infarction, unspecified: Secondary | ICD-10-CM

## 2015-03-15 LAB — LIPID PANEL
Cholesterol: 252 mg/dL — ABNORMAL HIGH (ref 0–200)
HDL: 74.3 mg/dL (ref >39.00)
LDL Cholesterol: 158 mg/dL — ABNORMAL HIGH (ref 0–99)
NonHDL: 177.4
Total CHOL/HDL Ratio: 3
Triglycerides: 97 mg/dL (ref 0.0–149.0)
VLDL: 19.4 mg/dL (ref 0.0–40.0)

## 2015-03-15 LAB — CREATININE, SERUM: CREATININE: 0.73 mg/dL (ref 0.40–1.20)

## 2015-03-15 LAB — HEMOGLOBIN A1C: Hgb A1c MFr Bld: 5.8 % (ref 4.6–6.5)

## 2015-03-15 LAB — SEDIMENTATION RATE: Sed Rate: 11 mm/hr (ref 0–22)

## 2015-03-15 LAB — BUN: BUN: 16 mg/dL (ref 6–23)

## 2015-03-15 MED ORDER — IOPAMIDOL (ISOVUE-370) INJECTION 76%
100.0000 mL | Freq: Once | INTRAVENOUS | Status: AC | PRN
  Administered 2015-03-15: 100 mL via INTRAVENOUS

## 2015-03-15 MED ORDER — CLONAZEPAM 0.5 MG PO TABS: 0.2500 mg | ORAL_TABLET | Freq: Two times a day (BID) | ORAL | Status: DC

## 2015-03-15 NOTE — Telephone Encounter (Signed)
I attempted to contact patient via phone today regarding the results of CT/A head and neck, however there was no answer and her mailbox was full so I was unable to leave a message to return my call, I will try again on Monday.  Donika K. Posey Pronto, DO

## 2015-03-15 NOTE — Patient Instructions (Addendum)
1.  Discontinue gabapentin  2.  Increase aspirin 325mg  daily 3.  Start clonazepam 0.25mg  twice daily.  Side effects include sleepiness.  4.  CT head, CT angiogram head and neck 5.  MRI brain 6.  Monitor blood pressure at home 7.  Check fasting lipid panel  We will call you with the results and discuss the next steps

## 2015-03-15 NOTE — Progress Notes (Signed)
Follow-up Visit   Date: 03/15/2015    Kelly Elliott MRN: [id number] DOB: 09-13-1940   Interim History: Kelly Elliott is a 75 y.o. left-handed Caucasian female with diabetes mellitus, hypertension, hypothyroidism, anxiety, and hyperlipidemia returning to the clinic with new complaints of acute left sided involuntary movements.  The patient was accompanied to the clinic by self.  History of present illness: She underwent left knee arthroscopy in December 2015 and since then she noticed right leg weakness, because of buckling sensation. She feels that her quadriceps muscles are weak and she has sharp pain over the same area. She applies a cold pack over the thigh which alleviates the pain in the thigh. She had episodic sharp pain involving the right lower leg, lasting a few seconds. She also complains of numbness and tingling over the thigh and knee. Over the past year, she has fallen three times because of her right leg buckling. She has sharp stabbing pain over the right knee, which is worse at night, and occurs several times throughout the night. Nothing improves or exacerbates the pain. She has not done PT for her right leg. She walks with a cane for support. She has tried hydrocodone, tylenol, ibuprofen, and naproxsyn.   UPDATE 03/15/2015:  She started PT for her right leg and her therapist said she had tight quadriceps so has been working on stretching exercises.  She was doing well until 2-days ago, when she noticed sudden onset of tapping of her left foot while taking a shower and later when she went to the grocery store, her arm started flailing.  Since then, she has been unable to control the movement of the left side.  If she tucks her leg behind the right foot, the movements subside and become less apparent.  There has been no worsening of symptoms since onset.  She denies any slurred speech, swallowing difficulty, vision changes, facial droop, numbness/tingling, or  weakness.  She is worried that she has had a stroke. She has been using a walker since the past two days.    Of note, her diabetes medication was changed to glimepiride from glipizide due to being on back order the day prior to symptom onset, but she does not recall having spells of hypoglycemia.  She had no personal history of stroke or TIA.  Her father had a stroke.  She takes a daily aspirin. Her blood pressure has always been elevated at our office including today, but she says that it is always normal at home (140s/80s).     Medications:  Current Outpatient Prescriptions on File Prior to Visit  Medication Sig Dispense Refill  . acetaminophen (TYLENOL ARTHRITIS PAIN) 650 MG CR tablet Take 650 mg by mouth every 8 (eight) hours as needed for pain.    . busPIRone (BUSPAR) 30 MG tablet TAKE ONE (1) TABLET BY MOUTH TWO (2) TIMES DAILY 60 tablet 5  . Cholecalciferol (VITAMIN D) 2000 UNITS CAPS Take 1 capsule by mouth every morning.    . docusate sodium 100 MG CAPS Take 100 mg by mouth 2 (two) times daily. 10 capsule 0  . ferrous sulfate 325 (65 FE) MG tablet Take 1 tablet (325 mg total) by mouth 3 (three) times daily after meals.  3  . gabapentin (NEURONTIN) 300 MG capsule Take 1 tablet at bedtime for one week, then increase to one tablet twice daily 60 capsule 5  . glimepiride (AMARYL) 2 MG tablet Take 1 tablet (2 mg total) by mouth daily  before breakfast. 30 tablet 2  . glucose blood (ONETOUCH VERIO) test strip CHECK BLOOD SUGAR ONCE DAILY (Patient taking differently: 1 each by Other route every morning. ) 100 each 12  . HYDROcodone-acetaminophen (NORCO/VICODIN) 5-325 MG per tablet Take 1 tablet by mouth every 6 (six) hours as needed for moderate pain. 30 tablet 0  . levothyroxine (SYNTHROID, LEVOTHROID) 100 MCG tablet TAKE ONE (1) TABLET BY MOUTH EVERY DAY BEFORE BREAKFAST 30 tablet 6  . lisinopril (PRINIVIL,ZESTRIL) 20 MG tablet TAKE TWO (2) TABLETS BY MOUTH EVERY MORNING AND TAKE ONE TABLET BY  MOUTH EVERY EVENING 90 tablet 5  . Multiple Vitamins-Minerals (CENTRUM SILVER ULTRA WOMENS) TABS Take 1 tablet by mouth every morning.     Letta Pate DELICA LANCETS FINE MISC 1 Device by Does not apply route daily. CHECK BLOOD SUGAR ONCE DAILY (Patient taking differently: 1 Device by Does not apply route every morning. ) 100 each 12  . oxyCODONE-acetaminophen (ROXICET) 5-325 MG per tablet 1-2 po q4h prn severe pain 60 tablet 0  . polyethylene glycol (MIRALAX / GLYCOLAX) packet Take 17 g by mouth 2 (two) times daily. 14 each 0  . Tdap (ADACEL) 06-10-13.5 LF-MCG/0.5 injection Inject 0.5 mLs into the muscle once. 0.5 mL 0  . triamcinolone ointment (KENALOG) 0.1 % Apply 1 application topically daily as needed (eczema).     No current facility-administered medications on file prior to visit.    Allergies:  Allergies  Allergen Reactions  . Corticosteroids Other (See Comments)    Severe pain  . Fluorescein   . Menthol     Review of Systems:  CONSTITUTIONAL: No fevers, chills, night sweats, or weight loss.  EYES: No visual changes or eye pain ENT: No hearing changes.  No history of nose bleeds.   RESPIRATORY: No cough, wheezing and shortness of breath.   CARDIOVASCULAR: Negative for chest pain, and palpitations.   GI: Negative for abdominal discomfort, blood in stools or black stools.  No recent change in bowel habits.   GU:  No history of incontinence.   MUSCLOSKELETAL: No history of joint pain or swelling.  No myalgias.   SKIN: Negative for lesions, rash, and itching.   ENDOCRINE: Negative for cold or heat intolerance, polydipsia or goiter.   PSYCH:  + depression or anxiety symptoms.   NEURO: As Above.   Vital Signs:  BP 194/90 mmHg  Pulse 74  Wt 186 lb 6 oz (84.539 kg)  SpO2 96% Pain Scale: 0 on a scale of 0-10   General: Anxious-appearing, intermittent involunatry movements of the left arm and leg Neck: No carotid bruit CV: regular rate and rhythm Ext: No  deformities  Neurological Exam: MENTAL STATUS including orientation to time, place, person, recent and remote memory, attention span and concentration, language, and fund of knowledge is normal.  Speech is not dysarthric. Repetition and comprehension intact.  CRANIAL NERVES: No visual field defects.  Pupils equal round and reactive to light.  Normal conjugate, extra-ocular eye movements in all directions of gaze.  No ptosis. Normal facial sensation.  Face is symmetric. Palate elevates symmetrically.  Tongue is midline.  MOTOR:  Prominent left hemiballismus and choreoform movements of the arm and leg.  No drift.  Right Upper Extremity:    Left Upper Extremity:    Deltoid  5/5   Deltoid  5/5   Biceps  5/5   Biceps  5/5   Triceps  5/5   Triceps  5/5   Wrist extensors  5/5   Wrist  extensors  5/5   Wrist flexors  5/5   Wrist flexors  5/5   Finger extensors  5/5   Finger extensors  5/5   Finger flexors  5/5   Finger flexors  5/5   Dorsal interossei  5/5   Dorsal interossei  5/5   Abductor pollicis  5/5   Abductor pollicis  5/5   Tone (Ashworth scale)  0  Tone (Ashworth scale)  0   Right Lower Extremity:    Left Lower Extremity:    Hip flexors  4/5   Hip flexors  5/5   Hip extensors  4/5   Hip extensors  5/5   Knee flexors  5/5   Knee flexors  5/5   Knee extensors  5/5   Knee extensors  5/5   Dorsiflexors  5/5   Dorsiflexors  5/5   Plantarflexors  5/5   Plantarflexors  5/5   Toe extensors  5/5   Toe extensors  5/5   Toe flexors  5/5   Toe flexors  5/5   Tone (Ashworth scale)  0  Tone (Ashworth scale)  0    MSRs:  Right                                                                 Left brachioradialis 2+  brachioradialis 2+  biceps 2+  biceps 2+  triceps 2+  triceps 2+  patellar 2+  patellar 2+  ankle jerk 2+  ankle jerk 2+  Hoffman no  Hoffman no  plantar response down  plantar response down   SENSORY:  Intact to vibration throughout, no neglect or  extinction.  COORDINATION/GAIT:  Normal finger-to- nose-finge.  Gait is assisted with rollator and appears slow, cautious, with intermittent flailing of the left foot.   Data: MRI lumbar spine wo contrast 07/28/2014 No change in the appearance lumbar spine. Small foraminal extra foraminal protrusion on the left at L4-5 causes mild foraminal narrowing. No nerve root compression is identified.  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  I will obtain STAT CT head and CTA head and neck.  Her blood pressure is elevated in office suggesting possible hypertensive ischemic stroke.  She did not have any severe hypoglycemic spells.  Gabapentin can rarely cause movement disorders, so I will discontinue this for now, but I highly doubt it is due to this. In the meantime, increase aspirin to '325mg'$  daily and strictly monitor BP at home.   If her blood pressure remains >140/80 at home, she will need medication changes MRI brain wo contrast Check fasting lipid panel, ESR, HbA1c To manage her involuntary movements, start clonazepam 0.'25mg'$  BID. Continue physical therapy Stroke warning signs discussed  Further recommendations will be made based on the above results    The duration of this appointment visit was 45 minutes of face-to-face time with the patient.  Greater than 50% of  this time was spent in counseling, explanation of diagnosis, planning of further management, and coordination of care.   Thank you for allowing me to participate in patient's care.  If I can answer any additional questions, I would be pleased to do so.    Sincerely,    Donika  K. Posey Pronto, DO

## 2015-03-18 ENCOUNTER — Other Ambulatory Visit: Payer: Self-pay | Admitting: *Deleted

## 2015-03-18 MED ORDER — ATORVASTATIN CALCIUM 40 MG PO TABS: 40.0000 mg | ORAL_TABLET | Freq: Every day | ORAL | Status: DC

## 2015-03-18 NOTE — Telephone Encounter (Signed)
Patient called back and I gave her the # to [place].  She will call to schedule appt.

## 2015-03-18 NOTE — Telephone Encounter (Signed)
I called [facility] to set up the MRI brain wo and Angelita Ingles said that they have already tried to contact patient and her vm is full.  I called patient and the vm was full.  I called patient's daughter(Tracie) and left message for her to call me.

## 2015-03-19 ENCOUNTER — Telehealth: Payer: Self-pay | Admitting: Neurology

## 2015-03-19 DIAGNOSIS — I679 Cerebrovascular disease, unspecified: Secondary | ICD-10-CM

## 2015-03-19 DIAGNOSIS — G255 Other chorea: Secondary | ICD-10-CM

## 2015-03-19 DIAGNOSIS — I639 Cerebral infarction, unspecified: Secondary | ICD-10-CM

## 2015-03-19 DIAGNOSIS — I6320 Cerebral infarction due to unspecified occlusion or stenosis of unspecified precerebral arteries: Secondary | ICD-10-CM

## 2015-03-19 MED ORDER — CLONAZEPAM 0.5 MG PO TABS: 0.5000 mg | ORAL_TABLET | Freq: Two times a day (BID) | ORAL | Status: DC

## 2015-03-19 NOTE — Telephone Encounter (Signed)
Done.  Please fax Rx once I have signed it.  Donika K. Posey Pronto, DO

## 2015-03-19 NOTE — Telephone Encounter (Signed)
She is taking the whole pill instead of 1/2.

## 2015-03-19 NOTE — Telephone Encounter (Signed)
VM-PT called and said she needed a prescription called in for [facility] CB# [phone]

## 2015-03-22 ENCOUNTER — Telehealth: Payer: Self-pay | Admitting: Neurology

## 2015-03-22 NOTE — Telephone Encounter (Signed)
VM-PT called in regards to her MRI tomorrow/[name] CB# [phone]

## 2015-03-22 NOTE — Telephone Encounter (Signed)
Patient wants to come in next week to discuss results of MRI.  Instructed her to come in on Wednesday, Feb. 15 at 11:30.

## 2015-03-23 ENCOUNTER — Ambulatory Visit
Admission: RE | Admit: 2015-03-23 | Discharge: 2015-03-23 | Disposition: A | Discharge status: Home / Self Care | Payer: Medicare Other | Source: Ambulatory Visit | Attending: Neurology | Admitting: Neurology

## 2015-03-23 DIAGNOSIS — I679 Cerebrovascular disease, unspecified: Secondary | ICD-10-CM

## 2015-03-23 DIAGNOSIS — I639 Cerebral infarction, unspecified: Secondary | ICD-10-CM

## 2015-03-23 DIAGNOSIS — G255 Other chorea: Secondary | ICD-10-CM

## 2015-03-23 DIAGNOSIS — I6320 Cerebral infarction due to unspecified occlusion or stenosis of unspecified precerebral arteries: Secondary | ICD-10-CM

## 2015-03-27 ENCOUNTER — Encounter: Payer: Self-pay | Admitting: Neurology

## 2015-03-27 ENCOUNTER — Ambulatory Visit (INDEPENDENT_AMBULATORY_CARE_PROVIDER_SITE_OTHER): Payer: Medicare Other | Admitting: Neurology

## 2015-03-27 ENCOUNTER — Other Ambulatory Visit (INDEPENDENT_AMBULATORY_CARE_PROVIDER_SITE_OTHER): Payer: Medicare Other

## 2015-03-27 ENCOUNTER — Telehealth: Payer: Self-pay | Admitting: *Deleted

## 2015-03-27 VITALS — BP 130/78 | HR 89 | Wt 183.2 lb

## 2015-03-27 DIAGNOSIS — I639 Cerebral infarction, unspecified: Secondary | ICD-10-CM

## 2015-03-27 DIAGNOSIS — I679 Cerebrovascular disease, unspecified: Secondary | ICD-10-CM | POA: Diagnosis not present

## 2015-03-27 DIAGNOSIS — R29898 Other symptoms and signs involving the musculoskeletal system: Secondary | ICD-10-CM | POA: Diagnosis not present

## 2015-03-27 DIAGNOSIS — G255 Other chorea: Secondary | ICD-10-CM | POA: Diagnosis not present

## 2015-03-27 LAB — COMPREHENSIVE METABOLIC PANEL
ALBUMIN: 4.5 g/dL (ref 3.5–5.2)
ALT: 16 U/L (ref 0–35)
AST: 20 U/L (ref 0–37)
Alkaline Phosphatase: 62 U/L (ref 39–117)
BUN: 31 mg/dL — AB (ref 6–23)
CALCIUM: 9.8 mg/dL (ref 8.4–10.5)
CHLORIDE: 98 meq/L (ref 96–112)
CO2: 27 meq/L (ref 19–32)
Creatinine, Ser: 0.76 mg/dL (ref 0.40–1.20)
GFR: 79.4 mL/min (ref >60.00)
Glucose, Bld: 45 mg/dL — CL (ref 70–99)
POTASSIUM: 3.6 meq/L (ref 3.5–5.1)
SODIUM: 136 meq/L (ref 135–145)
Total Bilirubin: 0.5 mg/dL (ref 0.2–1.2)
Total Protein: 8.1 g/dL (ref 6.0–8.3)

## 2015-03-27 LAB — VITAMIN B12: VITAMIN B 12: 360 pg/mL (ref 211–911)

## 2015-03-27 LAB — CBC
HEMATOCRIT: 38.4 % (ref 36.0–46.0)
Hemoglobin: 13 g/dL (ref 12.0–15.0)
MCHC: 33.9 g/dL (ref 30.0–36.0)
MCV: 90.5 fl (ref 78.0–100.0)
Platelets: 215 10*3/uL (ref 150.0–400.0)
RBC: 4.24 Mil/uL (ref 3.87–5.11)
RDW: 13.1 % (ref 11.5–15.5)
WBC: 10.6 10*3/uL — ABNORMAL HIGH (ref 4.0–10.5)

## 2015-03-27 LAB — TSH: TSH: 1.68 u[IU]/mL (ref 0.35–4.50)

## 2015-03-27 LAB — FERRITIN: FERRITIN: 213 ng/mL (ref 10.0–291.0)

## 2015-03-27 NOTE — Telephone Encounter (Signed)
The [facility] lab called with a critical lab value: glucose 45.  I called patient and informed her.  She said that she did not eat breakfast this morning so that could be why.  Instructed her to drink orange juice if it gets low again and call her pcp about her medication per Dr. Delice Lesch.  Patient agreed with plan.

## 2015-03-27 NOTE — Patient Instructions (Addendum)
Goal is stroke prevention - continue aspirin 325mg  daily - continue atorvaststin 40mg  daily - monitor blood sugar and blood pressure at home  Continue physical therapy for leg strengthening  Continue clonazepam 0.5mg  twice daily for at least another month.  You can try to taper the medication to 0.25mg  twice daily as able.    Check blood testing  Return to clinic in 6-8 weeks

## 2015-03-27 NOTE — Progress Notes (Signed)
Follow-up Visit   Date: 03/27/2015    Kelly Elliott MRN: [id number] DOB: 1940-11-15   Interim History: Kelly Elliott is a 75 y.o. left-handed Caucasian female with diabetes mellitus, hypertension, hypothyroidism, anxiety, and hyperlipidemia returning to the clinic with left sided hemiballismus and hemichorea.  The patient was accompanied to the clinic by self.  History of present illness: She underwent left knee arthroscopy in December 2015 and since then she noticed right leg weakness, because of buckling sensation. She feels that her quadriceps muscles are weak and she has sharp pain over the same area. She applies a cold pack over the thigh which alleviates the pain in the thigh. She had episodic sharp pain involving the right lower leg, lasting a few seconds. She also complains of numbness and tingling over the thigh and knee. Over the past year, she has fallen three times because of her right leg buckling. She has sharp stabbing pain over the right knee, which is worse at night, and occurs several times throughout the night. Nothing improves or exacerbates the pain. She has not done PT for her right leg. She walks with a cane for support. She has tried hydrocodone, tylenol, ibuprofen, and naproxsyn.   UPDATE 03/15/2015:  She started PT for her right leg and her therapist said she had tight quadriceps so has been working on stretching exercises.  She was doing well until 2-days ago, when she noticed sudden onset of tapping of her left foot while taking a shower and later when she went to the grocery store, her arm started flailing.  Since then, she has been unable to control the movement of the left side.  If she tucks her leg behind the right foot, the movements subside and become less apparent.  There has been no worsening of symptoms since onset.  She denies any slurred speech, swallowing difficulty, vision changes, facial droop, numbness/tingling, or weakness.  She is  worried that she has had a stroke. She has been using a walker since the past two days.    Of note, her diabetes medication was changed to glimepiride from glipizide due to being on back order the day prior to symptom onset, but she does not recall having spells of hypoglycemia.  She had no personal history of stroke or TIA.  Her father had a stroke.  She takes a daily aspirin. Her blood pressure has always been elevated at our office including today, but she says that it is always normal at home (140s/80s).    UPDATE 03/27/2015:  She noticed a dramatic improvement in involuntary movements after taking clonazepam 0.5mg  twice daily.  She still has some abnormal movements.  Upon further questions, she denies any spells of hypoglycemia when symptoms started.  Interestingly, there was no evidence of acute stroke on her recent MRI, but she does have significant white matter disease.  Home blood pressure has been in SBP 119-160s, mostly <140.  Since her last visit, she restarted atorvastatin which she self discontinued several years ago.    Medications:  Current Outpatient Prescriptions on File Prior to Visit  Medication Sig Dispense Refill  . acetaminophen (TYLENOL ARTHRITIS PAIN) 650 MG CR tablet Take 650 mg by mouth every 8 (eight) hours as needed for pain.    Marland Kitchen atorvastatin (LIPITOR) 40 MG tablet Take 1 tablet (40 mg total) by mouth daily. 30 tablet 3  . busPIRone (BUSPAR) 30 MG tablet TAKE ONE (1) TABLET BY MOUTH TWO (2) TIMES DAILY 60 tablet 5  .  Cholecalciferol (VITAMIN D) 2000 UNITS CAPS Take 1 capsule by mouth every morning.    . clonazePAM (KLONOPIN) 0.5 MG tablet Take 1 tablet (0.5 mg total) by mouth 2 (two) times daily. 60 tablet 5  . docusate sodium 100 MG CAPS Take 100 mg by mouth 2 (two) times daily. 10 capsule 0  . ferrous sulfate 325 (65 FE) MG tablet Take 1 tablet (325 mg total) by mouth 3 (three) times daily after meals.  3  . gabapentin (NEURONTIN) 300 MG capsule Take 1 tablet at  bedtime for one week, then increase to one tablet twice daily 60 capsule 5  . glimepiride (AMARYL) 2 MG tablet Take 1 tablet (2 mg total) by mouth daily before breakfast. 30 tablet 2  . GLIPIZIDE XL 2.5 MG 24 hr tablet   4  . glucose blood (ONETOUCH VERIO) test strip CHECK BLOOD SUGAR ONCE DAILY (Patient taking differently: 1 each by Other route every morning. ) 100 each 12  . HYDROcodone-acetaminophen (NORCO/VICODIN) 5-325 MG per tablet Take 1 tablet by mouth every 6 (six) hours as needed for moderate pain. 30 tablet 0  . levothyroxine (SYNTHROID, LEVOTHROID) 100 MCG tablet TAKE ONE (1) TABLET BY MOUTH EVERY DAY BEFORE BREAKFAST 30 tablet 6  . lisinopril (PRINIVIL,ZESTRIL) 20 MG tablet TAKE TWO (2) TABLETS BY MOUTH EVERY MORNING AND TAKE ONE TABLET BY MOUTH EVERY EVENING 90 tablet 5  . LORazepam (ATIVAN) 1 MG tablet   0  . Multiple Vitamins-Minerals (CENTRUM SILVER ULTRA WOMENS) TABS Take 1 tablet by mouth every morning.     Glory Rosebush DELICA LANCETS FINE MISC 1 Device by Does not apply route daily. CHECK BLOOD SUGAR ONCE DAILY (Patient taking differently: 1 Device by Does not apply route every morning. ) 100 each 12  . oxyCODONE-acetaminophen (ROXICET) 5-325 MG per tablet 1-2 po q4h prn severe pain 60 tablet 0  . polyethylene glycol (MIRALAX / GLYCOLAX) packet Take 17 g by mouth 2 (two) times daily. 14 each 0  . Tdap (ADACEL) 06-10-13.5 LF-MCG/0.5 injection Inject 0.5 mLs into the muscle once. 0.5 mL 0  . triamcinolone ointment (KENALOG) 0.1 % Apply 1 application topically daily as needed (eczema).     No current facility-administered medications on file prior to visit.    Allergies:  Allergies  Allergen Reactions  . Corticosteroids Other (See Comments)    Severe pain  . Fluorescein   . Menthol     Review of Systems:  CONSTITUTIONAL: No fevers, chills, night sweats, or weight loss.  EYES: No visual changes or eye pain ENT: No hearing changes.  No history of nose bleeds.   RESPIRATORY:  No cough, wheezing and shortness of breath.   CARDIOVASCULAR: Negative for chest pain, and palpitations.   GI: Negative for abdominal discomfort, blood in stools or black stools.  No recent change in bowel habits.   GU:  No history of incontinence.   MUSCLOSKELETAL: No history of joint pain or swelling.  No myalgias.   SKIN: Negative for lesions, rash, and itching.   ENDOCRINE: Negative for cold or heat intolerance, polydipsia or goiter.   PSYCH:  + depression or anxiety symptoms.   NEURO: As Above.   Vital Signs:  BP 130/78 mmHg  Pulse 89  Wt 183 lb 4 oz (83.122 kg)  SpO2 98% Pain Scale: 0 on a scale of 0-10   General: Anxious-appearing, intermittent involunatry movements of the left arm and leg Neck: No carotid bruit CV: regular rate and rhythm Ext: No deformities  Neurological Exam: MENTAL STATUS including orientation to time, place, person, recent and remote memory, attention span and concentration, language, and fund of knowledge is normal.  Speech is not dysarthric. Repetition and comprehension intact.  CRANIAL NERVES: No visual field defects.  Pupils equal round and reactive to light.  Normal conjugate, extra-ocular eye movements in all directions of gaze.  No ptosis. Normal facial sensation.  Face is symmetric. Palate elevates symmetrically.  Tongue is midline.  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 No abnormal movements of the left arm.  No drift.  Right Upper Extremity:    Left Upper Extremity:    Deltoid  5/5   Deltoid  5/5   Biceps  5/5   Biceps  5/5   Triceps  5/5   Triceps  5/5   Wrist extensors  5/5   Wrist extensors  5/5   Wrist flexors  5/5   Wrist flexors  5/5   Finger extensors  5/5   Finger extensors  5/5   Finger flexors  5/5   Finger flexors  5/5   Dorsal interossei  5/5   Dorsal interossei  5/5   Abductor pollicis  5/5   Abductor pollicis  5/5   Tone (Ashworth scale)  0  Tone (Ashworth scale)  0   Right Lower Extremity:     Left Lower Extremity:    Hip flexors  4/5   Hip flexors  5/5   Hip extensors  4/5   Hip extensors  5/5   Knee flexors  5/5   Knee flexors  5/5   Knee extensors  5/5   Knee extensors  5/5   Dorsiflexors  5/5   Dorsiflexors  5/5   Plantarflexors  5/5   Plantarflexors  5/5   Toe extensors  5/5   Toe extensors  5/5   Toe flexors  5/5   Toe flexors  5/5   Tone (Ashworth scale)  0  Tone (Ashworth scale)  0    MSRs:  Right                                                                 Left brachioradialis 2+  brachioradialis 2+  biceps 2+  biceps 2+  triceps 2+  triceps 2+  patellar 2+  patellar 2+  ankle jerk 2+  ankle jerk 2+  Hoffman no  Hoffman no  plantar response down  plantar response down   SENSORY:  Intact to vibration throughou.  COORDINATION/GAIT:  Normal finger-to- nose-finger.  Gait is assisted with rollator and appears much more stable then previously.   Data: MRI/A head 03/24/2015: 1. No acute infarct. 2. Moderate generalized atrophy and advanced diffuse white matter disease reflecting the sequela of chronic microvascular ischemia. 3. 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 4. Remote lacunar infarct in the lateral right thalamus. 5. Mild to moderate narrowing in the mid right M1 segment. 6. High-grade stenosis of the proximal right A1 segment. 7. High-grade stenosis of the left vertebral artery just beyond the PICA. 8. Moderate narrowing of the right vertebral artery. 9. Fetal type posterior cerebral arteries bilaterally with a small P1 segment on the left. 10. The MRA demonstrates diffuse distal small vessel disease  as on the previous study.  CT/A head and neck 03/15/2015: 1. Moderate diffuse periventricular white matter disease. This likely reflects the sequela of chronic microvascular ischemia. 2. No acute intracranial abnormality. 3. Atherosclerotic changes and tortuosity of the internal carotid arteries bilaterally. 4.  Tortuosity of the cervical internal carotid arteries bilaterally with a focal 70% stenosis on the right. 5. Spondylosis of the cervical spine. 6. Mild narrowing in the proximal right M1 segment. 7. Mild to moderate medium and small vessel disease throughout the anterior and posterior circulation without other significant focal proximal stenosis.  MRI lumbar spine wo contrast 07/28/2014:  No change in the appearance lumbar spine. Small foraminal extra foraminal protrusion on the left at L4-5 causes mild foraminal narrowing. No nerve root compression is identified.  Lab Results  Component Value Date   HGBA1C 5.8 03/15/2015   Lab Results  Component Value Date   CHOL 252* 03/15/2015   HDL 74.30 03/15/2015   LDLCALC 158* 03/15/2015   LDLDIRECT 218.4 03/06/2013   TRIG 97.0 03/15/2015   CHOLHDL 3 03/15/2015   Lab Results  Component Value Date   ESRSEDRATE 11 03/15/2015     IMPRESSION/PLAN: Acute onset hemiballismus and hemichorea without evidence of acute stroke on MRI (early Feb 2017), clinically improved with clonazepam.  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  No hypoglycemic spells during symptom onset.   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  It is unlikely that she had a embolic phenomenon from this lesion as her symptoms persisted several days and had it been a stroke, imaging would have shown diffusion abnormalities.   I will look for secondary causes of her involuntary movements and check the following labs:  CBC, CMP, ceruloplasmin, copper, TSH, PTH, ferritin, B12, antigliadin antibody.  Consider checking ANA, antiphospholipid antibody, lupus anticoagulant, RPR, paraneoplastic antibodies going forward. Involuntary movements are well controlled on clonazepam 0.5mg   twice daily which can be continued for at least another month, then try to taper the medication to 0.25mg  twice daily as able.    She has significant cerebrovascular disease and intracranial atherosclerosis. From a secondary stroke prevention standpoint: - continue aspirin 325mg  daily - continue atorvaststin 40mg  daily - monitor blood sugar and blood pressure at home  For leg weakness, continue physical therapy for leg strengthening  Return to clinic in 6 weeks  The duration of this appointment visit was 30 minutes of face-to-face time with the patient.  Greater than 50% of this time was spent in counseling, explanation of diagnosis, planning of further management, and coordination of care.   Thank you for allowing me to participate in patient's care.  If I can answer any additional questions, I would be pleased to do so.    Sincerely,    Frieda Arnall K. Posey Pronto, DO

## 2015-03-28 ENCOUNTER — Telehealth: Payer: Self-pay | Admitting: Neurology

## 2015-03-28 LAB — GLIA (IGA/G) + TTG IGA
GLIADIN IGA: 7 U (ref <20)
GLIADIN IGG: 2 U (ref <20)
TISSUE TRANSGLUTAMINASE AB, IGA: 1 U/mL (ref <4)

## 2015-03-28 LAB — PARATHYROID HORMONE, INTACT (NO CA): PTH: 41 pg/mL (ref 14–64)

## 2015-03-28 NOTE — Telephone Encounter (Signed)
VM-PT left message she had a question about her MRI scan/[name] CB# [phone]

## 2015-03-28 NOTE — Telephone Encounter (Signed)
Pt needs to talk to someone about what was talked about yesterday please call [phone]

## 2015-03-29 LAB — CERULOPLASMIN: Ceruloplasmin: 30 mg/dL (ref 18–53)

## 2015-03-29 LAB — ANA: Anti Nuclear Antibody(ANA): NEGATIVE

## 2015-03-30 LAB — COPPER, SERUM: COPPER: 134 ug/dL (ref 70–175)

## 2015-04-01 ENCOUNTER — Telehealth: Payer: Self-pay | Admitting: Neurology

## 2015-04-01 NOTE — Telephone Encounter (Signed)
See next note

## 2015-04-01 NOTE — Telephone Encounter (Signed)
PT returned your call/[name] °

## 2015-04-02 ENCOUNTER — Encounter: Payer: Self-pay | Admitting: *Deleted

## 2015-05-16 ENCOUNTER — Encounter: Payer: Self-pay | Admitting: Neurology

## 2015-05-16 ENCOUNTER — Ambulatory Visit (INDEPENDENT_AMBULATORY_CARE_PROVIDER_SITE_OTHER): Payer: Medicare Other | Admitting: Neurology

## 2015-05-16 VITALS — BP 140/80 | HR 71 | Ht 64.0 in | Wt 187.4 lb

## 2015-05-16 DIAGNOSIS — I639 Cerebral infarction, unspecified: Secondary | ICD-10-CM | POA: Diagnosis not present

## 2015-05-16 DIAGNOSIS — R29898 Other symptoms and signs involving the musculoskeletal system: Secondary | ICD-10-CM | POA: Diagnosis not present

## 2015-05-16 DIAGNOSIS — G255 Other chorea: Secondary | ICD-10-CM

## 2015-05-16 NOTE — Progress Notes (Signed)
Follow-up Visit   Date: 05/16/2015    Kelly Elliott MRN: [id number] DOB: 24-Nov-1940   Interim History: Kelly Elliott is a 75 y.o. left-handed Caucasian female with diabetes mellitus, hypertension, hypothyroidism, anxiety, and hyperlipidemia returning to the clinic with left hemiballismus and hemichorea and right leg weakness.  The patient was accompanied to the clinic by self.  History of present illness: She underwent left knee arthroscopy in December 2015 and since then she noticed right leg weakness, because of buckling sensation. She feels that her quadriceps muscles are weak and she has sharp pain over the same area. She applies a cold pack over the thigh which alleviates the pain in the thigh. She had episodic sharp pain involving the right lower leg, lasting a few seconds. She also complains of numbness and tingling over the thigh and knee. Over the past year, she has fallen three times because of her right leg buckling. She has sharp stabbing pain over the right knee, which is worse at night, and occurs several times throughout the night. Nothing improves or exacerbates the pain. She has not done PT for her right leg. She walks with a cane for support. She has tried hydrocodone, tylenol, ibuprofen, and naproxsyn.   UPDATE 03/15/2015:  She started PT for her right leg and her therapist said she had tight quadriceps so has been working on stretching exercises.  She was doing well until 2-days ago, when she noticed sudden onset of tapping of her left foot while taking a shower and later when she went to the grocery store, her arm started flailing.  Since then, she has been unable to control the movement of the left side.  If she tucks her leg behind the right foot, the movements subside and become less apparent.  There has been no worsening of symptoms since onset.  She denies any slurred speech, swallowing difficulty, vision changes, facial droop, numbness/tingling, or  weakness.  She is worried that she has had a stroke. She has been using a walker since the past two days.    Of note, her diabetes medication was changed to glimepiride from glipizide due to being on back order the day prior to symptom onset, but she does not recall having spells of hypoglycemia.  She had no personal history of stroke or TIA.  Her father had a stroke.  She takes a daily aspirin. Her blood pressure has always been elevated at our office including today, but she says that it is always normal at home (140s/80s).    UPDATE 03/27/2015:  She noticed a dramatic improvement in involuntary movements after taking clonazepam 0.5mg  twice daily.  She still has some abnormal movements.  Upon further questions, she denies any spells of hypoglycemia when symptoms started.  Interestingly, there was no evidence of acute stroke on her recent MRI, but she does have significant white matter disease and atherosclerosis.  Home blood pressure has been in SBP 119-160s, mostly <140.  Since her last visit, she restarted atorvastatin which she self discontinued several years ago.   UPDATE 05/16/2015: Her involuntary movements are well controlled on clonazepam 0.5mg  twice daily. Her left arm does not bother her any more, but left foot continues to invert and she is fearful that it could interfere with walking causing her to falls.   Fortunately, she has not had any interval falls.  She continues to go to PT and has noticed that deep massage helps alleviate right leg pain.  She will be moving  to [place], [place] in August to live with her daughter.  She has been monitoring her blood pressure and sugar at home, which has been within normal ranges.  No new complaints today.    Medications:  Current Outpatient Prescriptions on File Prior to Visit  Medication Sig Dispense Refill  . acetaminophen (TYLENOL ARTHRITIS PAIN) 650 MG CR tablet Take 650 mg by mouth every 8 (eight) hours as needed for pain.    Marland Kitchen atorvastatin (LIPITOR)  40 MG tablet Take 1 tablet (40 mg total) by mouth daily. 30 tablet 3  . busPIRone (BUSPAR) 30 MG tablet TAKE ONE (1) TABLET BY MOUTH TWO (2) TIMES DAILY 60 tablet 5  . Cholecalciferol (VITAMIN D) 2000 UNITS CAPS Take 1 capsule by mouth every morning.    . clonazePAM (KLONOPIN) 0.5 MG tablet Take 1 tablet (0.5 mg total) by mouth 2 (two) times daily. 60 tablet 5  . docusate sodium 100 MG CAPS Take 100 mg by mouth 2 (two) times daily. 10 capsule 0  . ferrous sulfate 325 (65 FE) MG tablet Take 1 tablet (325 mg total) by mouth 3 (three) times daily after meals.  3  . glimepiride (AMARYL) 2 MG tablet Take 1 tablet (2 mg total) by mouth daily before breakfast. 30 tablet 2  . GLIPIZIDE XL 2.5 MG 24 hr tablet   4  . glucose blood (ONETOUCH VERIO) test strip CHECK BLOOD SUGAR ONCE DAILY (Patient taking differently: 1 each by Other route every morning. ) 100 each 12  . HYDROcodone-acetaminophen (NORCO/VICODIN) 5-325 MG per tablet Take 1 tablet by mouth every 6 (six) hours as needed for moderate pain. 30 tablet 0  . levothyroxine (SYNTHROID, LEVOTHROID) 100 MCG tablet TAKE ONE (1) TABLET BY MOUTH EVERY DAY BEFORE BREAKFAST 30 tablet 6  . lisinopril (PRINIVIL,ZESTRIL) 20 MG tablet TAKE TWO (2) TABLETS BY MOUTH EVERY MORNING AND TAKE ONE TABLET BY MOUTH EVERY EVENING 90 tablet 5  . LORazepam (ATIVAN) 1 MG tablet   0  . Multiple Vitamins-Minerals (CENTRUM SILVER ULTRA WOMENS) TABS Take 1 tablet by mouth every morning.     Kelly Elliott 1 Device by Does not apply route daily. CHECK BLOOD SUGAR ONCE DAILY (Patient taking differently: 1 Device by Does not apply route every morning. ) 100 each 12  . oxyCODONE-acetaminophen (ROXICET) 5-325 MG per tablet 1-2 po q4h prn severe pain 60 tablet 0  . polyethylene glycol (MIRALAX / GLYCOLAX) packet Take 17 g by mouth 2 (two) times daily. 14 each 0  . Tdap (ADACEL) 06-10-13.5 LF-MCG/0.5 injection Inject 0.5 mLs into the muscle once. 0.5 mL 0  .  triamcinolone ointment (KENALOG) 0.1 % Apply 1 application topically daily as needed (eczema).     No current facility-administered medications on file prior to visit.    Allergies:  Allergies  Allergen Reactions  . Corticosteroids Other (See Comments)    Severe pain  . Fluorescein   . Menthol     Review of Systems:  CONSTITUTIONAL: No fevers, chills, night sweats, or weight loss.  EYES: No visual changes or eye pain ENT: No hearing changes.  No history of nose bleeds.   RESPIRATORY: No cough, wheezing and shortness of breath.   CARDIOVASCULAR: Negative for chest pain, and palpitations.   GI: Negative for abdominal discomfort, blood in stools or black stools.  No recent change in bowel habits.   GU:  No history of incontinence.   MUSCLOSKELETAL: No history of joint pain or swelling.  No  myalgias.   SKIN: Negative for lesions, rash, and itching.   ENDOCRINE: Negative for cold or heat intolerance, polydipsia or goiter.   PSYCH:  + depression or anxiety symptoms.   NEURO: As Above.   Vital Signs:  BP 140/80 mmHg  Pulse 71  Ht 5\' 4"  (1.626 m)  Wt 187 lb 6 oz (84.993 kg)  BMI 32.15 kg/m2  SpO2 98% Pain Scale: 0 on a scale of 0-10   Neurological Exam: MENTAL STATUS including orientation to time, place, person, recent and remote memory, attention span and concentration, language, and fund of knowledge is normal.  Speech is not dysarthric.   CRANIAL NERVES:  Pupils equal round and reactive to light.  Normal conjugate, extra-ocular eye movements in all directions of gaze.  No ptosis.  Face is symmetric. Palate elevates symmetrically.  Tongue is midline.  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 No abnormal movements of the left arm.  No drift.  Right Upper Extremity:    Left Upper Extremity:    Deltoid  5/5   Deltoid  5/5   Biceps  5/5   Biceps  5/5   Triceps  5/5   Triceps  5/5   Wrist extensors  5/5   Wrist extensors  5/5   Wrist flexors   5/5   Wrist flexors  5/5   Finger extensors  5/5   Finger extensors  5/5   Finger flexors  5/5   Finger flexors  5/5   Dorsal interossei  5/5   Dorsal interossei  5/5   Abductor pollicis  5/5   Abductor pollicis  5/5   Tone (Ashworth scale)  0  Tone (Ashworth scale)  0   Right Lower Extremity:    Left Lower Extremity:    Hip flexors  5-/5   Hip flexors  5/5   Hip extensors  5-/5   Hip extensors  5/5   Knee flexors  5/5   Knee flexors  5/5   Knee extensors  5/5   Knee extensors  5/5   Dorsiflexors  5/5   Dorsiflexors  5/5   Plantarflexors  5/5   Plantarflexors  5/5   Toe extensors  5/5   Toe extensors  5/5   Toe flexors  5/5   Toe flexors  5/5   Tone (Ashworth scale)  0  Tone (Ashworth scale)  0   SENSORY:  Intact to vibration throughout.  COORDINATION/GAIT:  Normal finger-to- nose-finger.  Gait is assisted with rollator and appears stable then previously.   Data: MRI/A head 03/24/2015: 1. No acute infarct. 2. Moderate generalized atrophy and advanced diffuse white matter disease reflecting the sequela of chronic microvascular ischemia. 3. 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 4. Remote lacunar infarct in the lateral right thalamus. 5. Mild to moderate narrowing in the mid right M1 segment. 6. High-grade stenosis of the proximal right A1 segment. 7. High-grade stenosis of the left vertebral artery just beyond the PICA. 8. Moderate narrowing of the right vertebral artery. 9. Fetal type posterior cerebral arteries bilaterally with a small P1 segment on the left. 10. The MRA demonstrates diffuse distal small vessel disease as on the previous study.  CT/A head and neck 03/15/2015: 1. Moderate diffuse periventricular white matter disease. This likely reflects the sequela of chronic microvascular ischemia. 2. No acute intracranial abnormality. 3. Atherosclerotic changes and tortuosity of the internal carotid arteries bilaterally. 4. Tortuosity  of the cervical  internal carotid arteries bilaterally with a focal 70% stenosis on the right. 5. Spondylosis of the cervical spine. 6. Mild narrowing in the proximal right M1 segment. 7. Mild to moderate medium and small vessel disease throughout the anterior and posterior circulation without other significant focal proximal stenosis.  MRI lumbar spine wo contrast 07/28/2014:  No change in the appearance lumbar spine. Small foraminal extra foraminal protrusion on the left at L4-5 causes mild foraminal narrowing. No nerve root compression is identified.  Lab Results  Component Value Date   HGBA1C 5.8 03/15/2015   Lab Results  Component Value Date   CHOL 252* 03/15/2015   HDL 74.30 03/15/2015   LDLCALC 158* 03/15/2015   LDLDIRECT 218.4 03/06/2013   TRIG 97.0 03/15/2015   CHOLHDL 3 03/15/2015   Lab Results  Component Value Date   ESRSEDRATE 11 03/15/2015   Labs 03/27/2015:  ANA neg, CBC normal, CMP with glucose 45*, copper 134, ceruloplasmin 30, vitamin B12 360, PTH 41, ferritin 213, TSH 1.68, celiac panel negative   IMPRESSION/PLAN: 1.  Acute onset hemiballismus and hemichorea without evidence of acute stroke on MRI (early Feb 2017), clinically improved with clonazepam.  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  No hypoglycemic spells during symptom onset, although it is interesting that her chemistry panel showed hypoglycemia (blood sugar was 45), but she denied any symptoms of hypoglycemia.  Secondary causes for involuntary movements were normal as noted above except that her blood glucose was low at 45.  Involuntary movements are well controlled on clonazepam 0.5mg  twice daily.  I will try to taper this to 0.25mg  twice daily, as able.  Because she does have mild choreaform movements during the day, it may be difficult to wean the  dose.  2.  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  It is unlikely that she had a embolic phenomenon from this lesion as her symptoms persisted several days and had it been a stroke, imaging would have shown diffusion abnormalities.   She has significant cerebrovascular disease and intracranial atherosclerosis. From a secondary stroke prevention standpoint: - continue aspirin 325mg  daily - continue atorvaststin 40mg  daily - monitor blood sugar and blood pressure at home  3.  Right leg shows improved strength with PT.  Recommend that she continue physical therapy and be compliant with her home exercises   Return to clinic in 3 months  The duration of this appointment visit was 30 minutes of face-to-face time with the patient.  Greater than 50% of this time was spent in counseling, explanation of diagnosis, planning of further management, and coordination of care.   Thank you for allowing me to participate in patient's care.  If I can answer any additional questions, I would be pleased to do so.    Sincerely,    Donika K. Posey Pronto, DO

## 2015-05-16 NOTE — Patient Instructions (Addendum)
1.  Try to reduce clonazepam as follows:    AM    PM  Week 1 0.25mg   (half tab)  0.5mg  (1 tablet)  Week 2 0.25mg  (half tab)  0.25mg  (half tab)  2.  If you develop worsening movements, you can go back to the higher dose that controls symptoms the best  3.  Continue physical therapy  Return to clinic in late July

## 2015-05-23 ENCOUNTER — Other Ambulatory Visit: Payer: Self-pay

## 2015-05-23 MED ORDER — LISINOPRIL 20 MG PO TABS: ORAL_TABLET | ORAL | Status: DC

## 2015-06-04 ENCOUNTER — Telehealth: Payer: Self-pay | Admitting: Family Medicine

## 2015-06-04 ENCOUNTER — Encounter: Payer: Self-pay | Admitting: Family Medicine

## 2015-06-04 NOTE — Telephone Encounter (Signed)
Attempted to contact patient 06/04/15 to reschedule appointment scheduled for July 23, 2015 with Dr, Carollee Herter. Left message on voicemail, mailed letter and appointment cancelled.

## 2015-06-12 ENCOUNTER — Other Ambulatory Visit: Payer: Self-pay

## 2015-06-12 MED ORDER — GLIMEPIRIDE 2 MG PO TABS: 2.0000 mg | ORAL_TABLET | Freq: Every day | ORAL | Status: DC

## 2015-06-13 ENCOUNTER — Other Ambulatory Visit: Payer: Self-pay

## 2015-06-13 MED ORDER — LEVOTHYROXINE SODIUM 100 MCG PO TABS: ORAL_TABLET | ORAL | Status: DC

## 2015-06-13 MED ORDER — LISINOPRIL 20 MG PO TABS: ORAL_TABLET | ORAL | Status: DC

## 2015-06-24 ENCOUNTER — Telehealth: Payer: Self-pay | Admitting: Neurology

## 2015-06-24 ENCOUNTER — Other Ambulatory Visit: Payer: Self-pay | Admitting: *Deleted

## 2015-06-24 ENCOUNTER — Telehealth: Payer: Self-pay

## 2015-06-24 DIAGNOSIS — I6320 Cerebral infarction due to unspecified occlusion or stenosis of unspecified precerebral arteries: Secondary | ICD-10-CM

## 2015-06-24 DIAGNOSIS — I639 Cerebral infarction, unspecified: Secondary | ICD-10-CM

## 2015-06-24 DIAGNOSIS — G255 Other chorea: Secondary | ICD-10-CM

## 2015-06-24 DIAGNOSIS — I679 Cerebrovascular disease, unspecified: Secondary | ICD-10-CM

## 2015-06-24 MED ORDER — CLONAZEPAM 0.5 MG PO TABS: 0.5000 mg | ORAL_TABLET | Freq: Two times a day (BID) | ORAL | Status: AC

## 2015-06-24 NOTE — Telephone Encounter (Signed)
Kelly Elliott 2040-03-09. Patient needs a refill on her Clonazepam .5 mg. Her Pharmacy is [facility]. Her number is [phone]. Thank you

## 2015-06-24 NOTE — Telephone Encounter (Signed)
Patient needs lisinopril (PRINIVIL,ZESTRIL) 20 MG tablet and levothyroxine (SYNTHROID, LEVOTHROID) 100 MCG tablet refilled to [facility]

## 2015-06-24 NOTE — Telephone Encounter (Signed)
Rx faxed

## 2015-06-26 MED ORDER — LEVOTHYROXINE SODIUM 100 MCG PO TABS: ORAL_TABLET | ORAL | Status: DC

## 2015-06-26 MED ORDER — LISINOPRIL 20 MG PO TABS: ORAL_TABLET | ORAL | Status: DC

## 2015-06-26 NOTE — Telephone Encounter (Signed)
Rx's sent to the pharmacy by e-script.//AB/CMA 

## 2015-07-09 ENCOUNTER — Other Ambulatory Visit: Payer: Self-pay | Admitting: *Deleted

## 2015-07-09 MED ORDER — ATORVASTATIN CALCIUM 40 MG PO TABS: 40.0000 mg | ORAL_TABLET | Freq: Every day | ORAL | Status: AC

## 2015-07-09 NOTE — Telephone Encounter (Addendum)
Relation to [id number] Call back number:[phone] Pharmacy: [facility], [place] [phone] (Fax)         Reason for call:  Patient states pharmacy never received lisinopril (PRINIVIL,ZESTRIL) 20 MG tablet

## 2015-07-09 NOTE — Telephone Encounter (Signed)
I spoke with Kelly Elliott at [facility] and she said the patient picked up her Lisinopril on 06/24/15 #90

## 2015-07-23 ENCOUNTER — Encounter: Payer: Medicare Other | Admitting: Family Medicine

## 2015-07-25 ENCOUNTER — Other Ambulatory Visit: Payer: Self-pay

## 2015-07-25 MED ORDER — LISINOPRIL 20 MG PO TABS: ORAL_TABLET | ORAL | Status: AC

## 2015-07-25 MED ORDER — GLIMEPIRIDE 2 MG PO TABS: 2.0000 mg | ORAL_TABLET | Freq: Every day | ORAL | Status: AC

## 2015-07-25 MED ORDER — LEVOTHYROXINE SODIUM 100 MCG PO TABS: ORAL_TABLET | ORAL | Status: AC

## 2015-07-25 MED ORDER — BUSPIRONE HCL 30 MG PO TABS: ORAL_TABLET | ORAL | Status: DC

## 2015-08-13 IMAGING — CR DG CHEST 2V
2 series · 2 of 2 positions shown · non-contrast
Comparison: None.

CLINICAL DATA: Preop. Occasional cough and congestion due to
allergies.

EXAM:
CHEST  2 VIEW

[w chest pa]
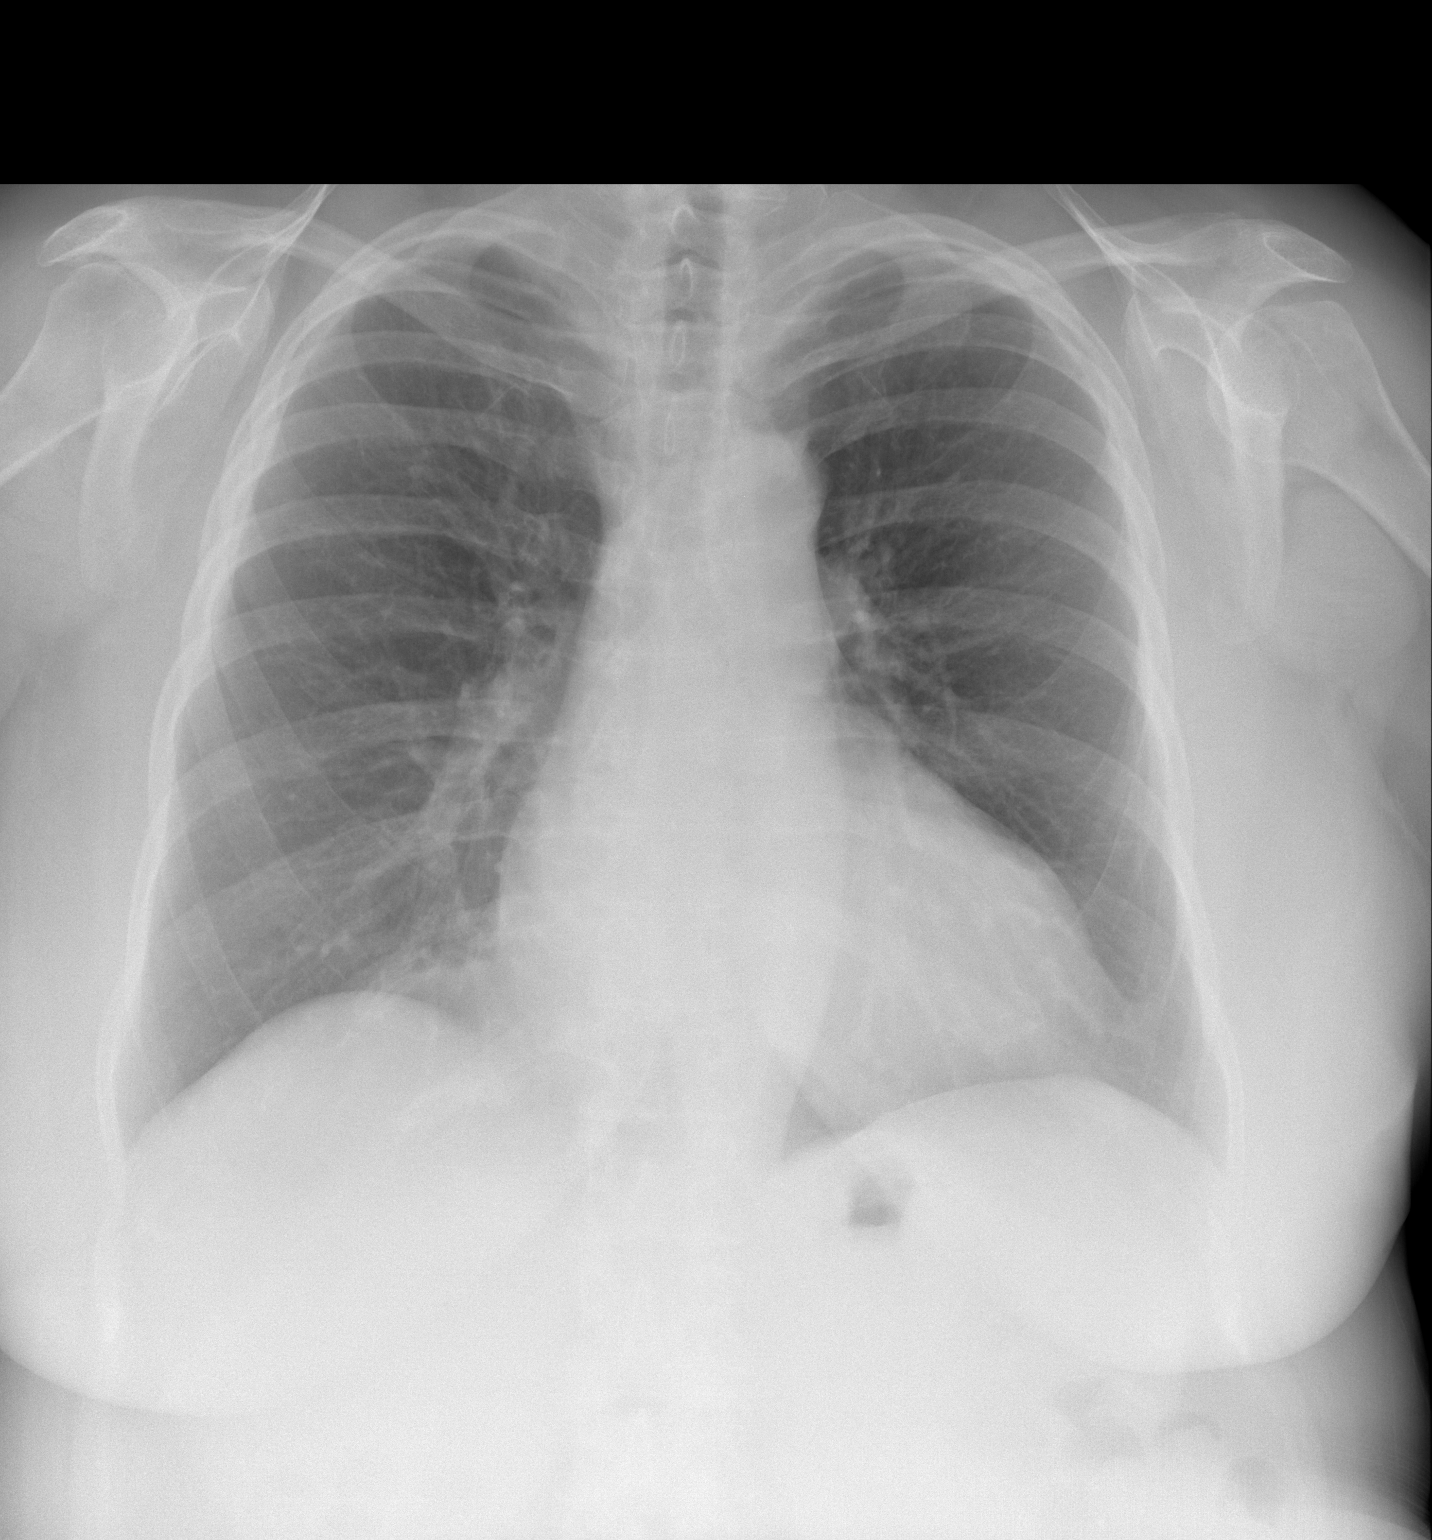

[w chest lat]
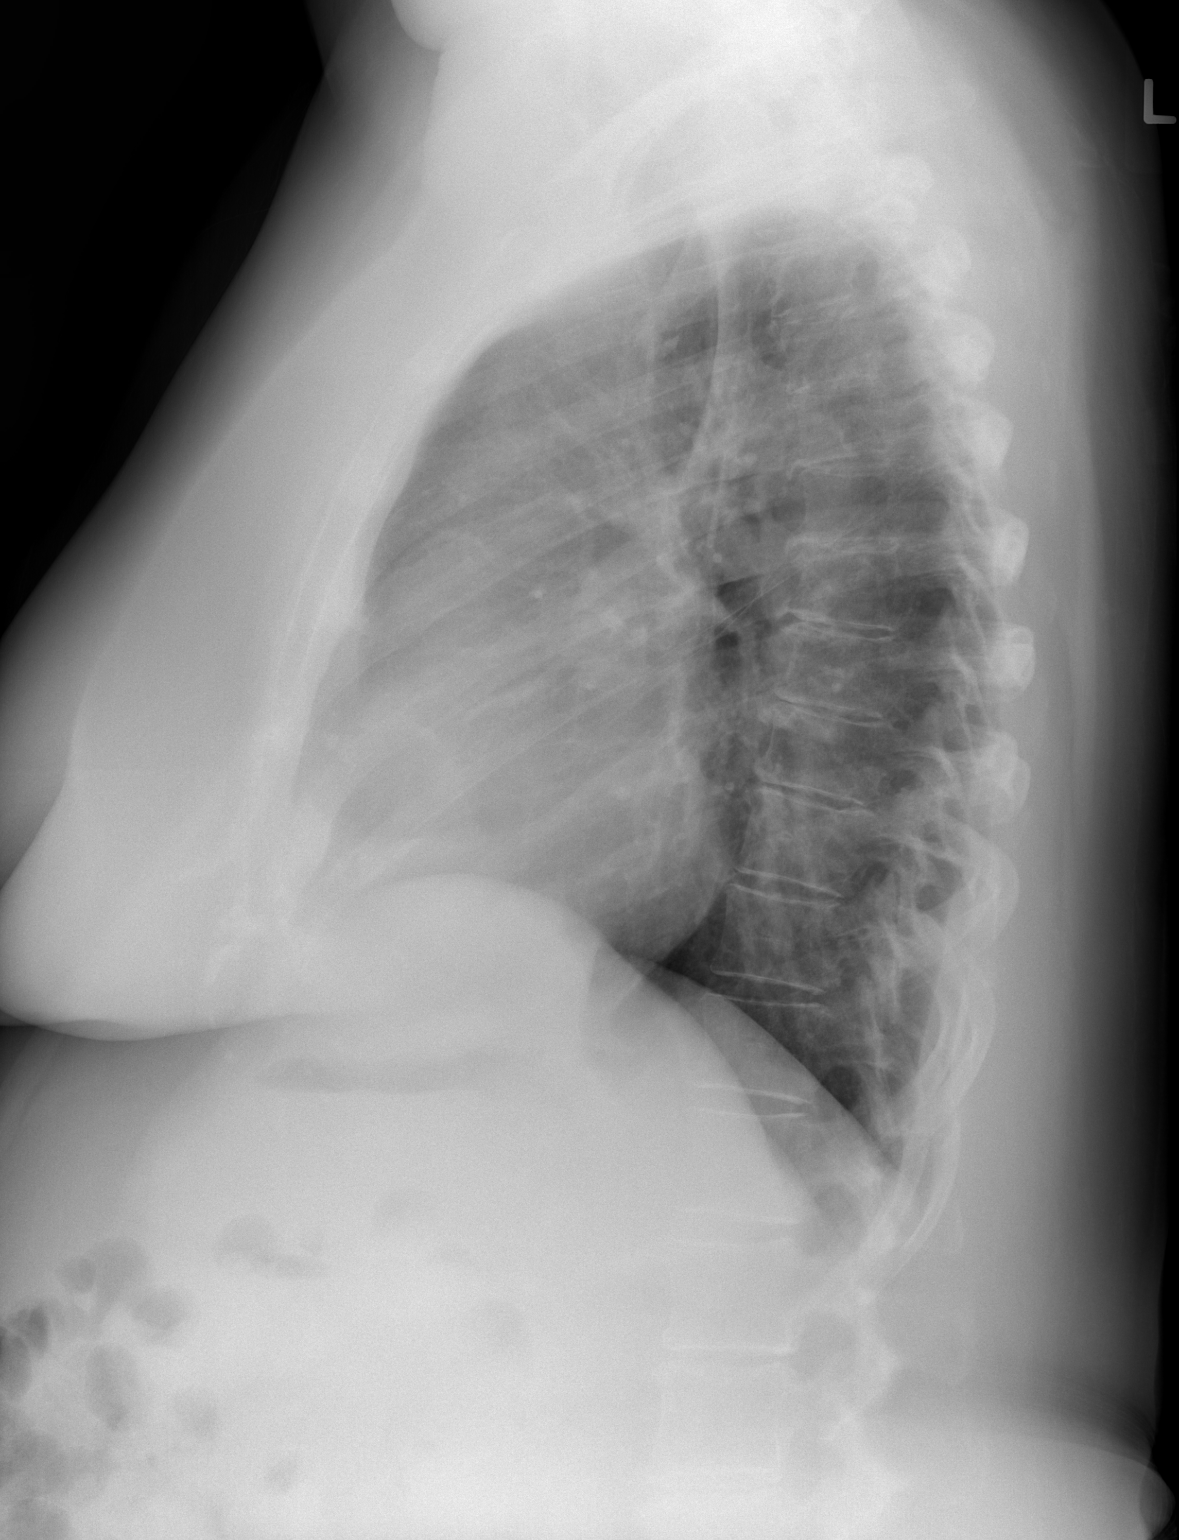

[2 of 2 positions shown; findings below may reference images not displayed]

FINDINGS: The heart size and mediastinal contours are within normal limits.
Both lungs are adequately inflated. Minimal opacification over the
right costophrenic angle not seen on the lateral film likely
prominent pericardiac fat. Mild degenerative change of the spine.
IMPRESSION: No active cardiopulmonary disease.

## 2015-08-30 ENCOUNTER — Encounter: Payer: Self-pay | Admitting: Neurology

## 2015-08-30 ENCOUNTER — Ambulatory Visit (INDEPENDENT_AMBULATORY_CARE_PROVIDER_SITE_OTHER): Payer: Medicare Other | Admitting: Neurology

## 2015-08-30 VITALS — BP 104/80 | HR 64 | Ht 64.0 in | Wt 194.0 lb

## 2015-08-30 DIAGNOSIS — G255 Other chorea: Secondary | ICD-10-CM

## 2015-08-30 DIAGNOSIS — M5416 Radiculopathy, lumbar region: Secondary | ICD-10-CM | POA: Diagnosis not present

## 2015-08-30 DIAGNOSIS — I639 Cerebral infarction, unspecified: Secondary | ICD-10-CM | POA: Diagnosis not present

## 2015-08-30 NOTE — Patient Instructions (Signed)
1.  Continue clonazepam 0.5mg  twice daily, you can try to reduce it to 0.25mg  twice daily. 2.  Continue your home exercises  Good luck with your move.  Please call my office when you have established care with your new providers and we can send our records.

## 2015-08-30 NOTE — Progress Notes (Signed)
Follow-up Visit   Date: 08/30/2015    Kelly Elliott MRN: [id number] DOB: 02/23/1940   Interim History: Kelly Elliott is a 75 y.o. left-handed Caucasian female with diabetes mellitus, hypertension, hypothyroidism, anxiety, and hyperlipidemia returning to the clinic with left hemiballismus and hemichorea and right leg weakness.  The patient was accompanied to the clinic by self.  History of present illness: She underwent left knee arthroscopy in December 2015 and since then she noticed right leg weakness, because of buckling sensation. She feels that her quadriceps muscles are weak and she has sharp pain over the same area. She applies a cold pack over the thigh which alleviates the pain in the thigh. She had episodic sharp pain involving the right lower leg, lasting a few seconds. She also complains of numbness and tingling over the thigh and knee. Over the past year, she has fallen three times because of her right leg buckling. She has sharp stabbing pain over the right knee, which is worse at night, and occurs several times throughout the night. Nothing improves or exacerbates the pain. She has not done PT for her right leg. She walks with a cane for support. She has tried hydrocodone, tylenol, ibuprofen, and naproxsyn.   UPDATE 03/15/2015:  She started PT for her right leg and her therapist said she had tight quadriceps so has been working on stretching exercises.  She was doing well until 2-days ago, when she noticed sudden onset of tapping of her left foot while taking a shower and later when she went to the grocery store, her arm started flailing.  Since then, she has been unable to control the movement of the left side.  If she tucks her leg behind the right foot, the movements subside and become less apparent.  There has been no worsening of symptoms since onset.  She denies any slurred speech, swallowing difficulty, vision changes, facial droop, numbness/tingling, or  weakness.  She is worried that she has had a stroke. She has been using a walker since the past two days.    Of note, her diabetes medication was changed to glimepiride from glipizide due to being on back order the day prior to symptom onset, but she does not recall having spells of hypoglycemia.  She had no personal history of stroke or TIA.  Her father had a stroke.  She takes a daily aspirin. Her blood pressure has always been elevated at our office including today, but she says that it is always normal at home (140s/80s).    UPDATE 03/27/2015:  She noticed a dramatic improvement in involuntary movements after taking clonazepam 0.5mg  twice daily.  She still has some abnormal movements.  Upon further questions, she denies any spells of hypoglycemia when symptoms started.  Interestingly, there was no evidence of acute stroke on her recent MRI, but she does have significant white matter disease and atherosclerosis.  Home blood pressure has been in SBP 119-160s, mostly <140.  Since her last visit, she restarted atorvastatin which she self discontinued several years ago.   UPDATE 05/16/2015: Her involuntary movements are well controlled on clonazepam 0.5mg  twice daily. Her left arm does not bother her any more, but left foot continues to invert and she is fearful that it could interfere with walking causing her to falls.   Fortunately, she has not had any interval falls.  She continues to go to PT and has noticed that deep massage helps alleviate right leg pain.  She will be moving  to [place], [place] in August to live with her daughter.  She has been monitoring her blood pressure and sugar at home, which has been within normal ranges.    UPDATE 08/30/2015:  She continues to take clonazepam 0.5mg  twice daily, which controls her chorea very well.  She has tried to reduce it to 0.25mg  twice daily, but in doing this, her movements quickly return.  She is moving to [place], [place] in August to live with her daughter.   She completed her balance therapy which significantly helped with her leg strength and balance.  She has new complaints of intermittent left hand numbness, which is sporadic during the day.  It does not wake her up from sleeping and she denies weakness.    Medications:  Current Outpatient Prescriptions on File Prior to Visit  Medication Sig Dispense Refill  . atorvastatin (LIPITOR) 40 MG tablet Take 1 tablet (40 mg total) by mouth daily. 90 tablet 3  . Cholecalciferol (VITAMIN D) 2000 UNITS CAPS Take 1 capsule by mouth every morning.    . clonazePAM (KLONOPIN) 0.5 MG tablet Take 1 tablet (0.5 mg total) by mouth 2 (two) times daily. 60 tablet 5  . glimepiride (AMARYL) 2 MG tablet Take 1 tablet (2 mg total) by mouth daily before breakfast. 90 tablet 2  . glucose blood (ONETOUCH VERIO) test strip CHECK BLOOD SUGAR ONCE DAILY (Patient taking differently: 1 each by Other route every morning. ) 100 each 12  . levothyroxine (SYNTHROID, LEVOTHROID) 100 MCG tablet TAKE ONE (1) TABLET BY MOUTH EVERY DAY BEFORE BREAKFAST 90 tablet 0  . lisinopril (PRINIVIL,ZESTRIL) 20 MG tablet TAKE TWO (2) TABLETS BY MOUTH EVERY MORNING AND TAKE ONE TABLET BY MOUTH EVERY EVENING 270 tablet 0  . Multiple Vitamins-Minerals (CENTRUM SILVER ULTRA WOMENS) TABS Take 1 tablet by mouth every morning.     Kelly Elliott DELICA LANCETS FINE MISC 1 Device by Does not apply route daily. CHECK BLOOD SUGAR ONCE DAILY (Patient taking differently: 1 Device by Does not apply route every morning. ) 100 each 12   No current facility-administered medications on file prior to visit.    Allergies:  Allergies  Allergen Reactions  . Corticosteroids Other (See Comments)    Severe pain  . Fluorescein   . Menthol     Review of Systems:  CONSTITUTIONAL: No fevers, chills, night sweats, or weight loss.  EYES: No visual changes or eye pain ENT: No hearing changes.  No history of nose bleeds.   RESPIRATORY: No cough, wheezing and shortness of  breath.   CARDIOVASCULAR: Negative for chest pain, and palpitations.   GI: Negative for abdominal discomfort, blood in stools or black stools.  No recent change in bowel habits.   GU:  No history of incontinence.   MUSCLOSKELETAL: No history of joint pain or swelling.  No myalgias.   SKIN: Negative for lesions, rash, and itching.   ENDOCRINE: Negative for cold or heat intolerance, polydipsia or goiter.   PSYCH:  + depression or anxiety symptoms.   NEURO: As Above.   Vital Signs:  BP 104/80 mmHg  Pulse 64  Ht 5\' 4"  (1.626 m)  Wt 194 lb (87.998 kg)  BMI 33.28 kg/m2  SpO2 98% Pain Scale: 0 on a scale of 0-10  Neurological Exam: MENTAL STATUS including orientation to time, place, person, recent and remote memory, attention span and concentration, language, and fund of knowledge is normal.  Speech is not dysarthric.   CRANIAL NERVES:  Pupils equal round and reactive  to light.  Normal conjugate, extra-ocular eye movements in all directions of gaze.  No ptosis.  Face is symmetric. Palate elevates symmetrically.  Tongue is midline.  MOTOR: Motor strength is 5/5 throughout including left ABP, mild 5-/5 with right hip flexion.  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  No hemiballismus or choreoform movements appreciated today.    SENSORY:  Intact to vibration throughout.  COORDINATION/GAIT:  Normal finger-to- nose-finger.  Gait is assisted with cane and unsteady at times.   Data: MRI/A head 03/24/2015: 1. No acute infarct. 2. Moderate generalized atrophy and advanced diffuse white matter disease reflecting the sequela of chronic microvascular ischemia. 3. 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 4. Remote lacunar infarct in the lateral right thalamus. 5. Mild to moderate narrowing in the mid right M1 segment. 6. High-grade stenosis of the proximal right A1 segment. 7. High-grade stenosis of the left vertebral artery  just beyond the PICA. 8. Moderate narrowing of the right vertebral artery. 9. Fetal type posterior cerebral arteries bilaterally with a small P1 segment on the left. 10. The MRA demonstrates diffuse distal small vessel disease as on the previous study.  CT/A head and neck 03/15/2015: 1. Moderate diffuse periventricular white matter disease. This likely reflects the sequela of chronic microvascular ischemia. 2. No acute intracranial abnormality. 3. Atherosclerotic changes and tortuosity of the internal carotid arteries bilaterally. 4. Tortuosity of the cervical internal carotid arteries bilaterally with a focal 70% stenosis on the right. 5. Spondylosis of the cervical spine. 6. Mild narrowing in the proximal right M1 segment. 7. Mild to moderate medium and small vessel disease throughout the anterior and posterior circulation without other significant focal proximal stenosis.  MRI lumbar spine wo contrast 07/28/2014:  No change in the appearance lumbar spine. Small foraminal extra foraminal protrusion on the left at L4-5 causes mild foraminal narrowing. No nerve root compression is identified.  Lab Results  Component Value Date   HGBA1C 5.8 03/15/2015   Lab Results  Component Value Date   CHOL 252* 03/15/2015   HDL 74.30 03/15/2015   LDLCALC 158* 03/15/2015   LDLDIRECT 218.4 03/06/2013   TRIG 97.0 03/15/2015   CHOLHDL 3 03/15/2015   Lab Results  Component Value Date   ESRSEDRATE 11 03/15/2015   Labs 03/27/2015:  ANA neg, CBC normal, CMP with glucose 45*, copper 134, ceruloplasmin 30, vitamin B12 360, PTH 41, ferritin 213, TSH 1.68, celiac panel negative   IMPRESSION/PLAN: 1.  Acute onset hemiballismus and hemichorea without evidence of acute stroke on MRI (early Feb 2017), clinically improved with clonazepam.  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  No hypoglycemic spells during symptom onset, although it is interesting that her chemistry panel showed hypoglycemia (blood sugar was 45), but she denied any symptoms of hypoglycemia.  Secondary causes for involuntary movements were normal as noted above except that her blood glucose was low at 45.  Involuntary movements are well controlled on clonazepam 0.5mg  twice daily.  Plan to taper to 0.25mg  twice daily, as able.   2.  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  It is unlikely that she had a embolic phenomenon from this lesion as her symptoms persisted several days and had it been a stroke, imaging would have shown diffusion abnormalities.  From a secondary stroke prevention standpoint: - continue aspirin 325mg  daily - continue atorvastatin 40mg  daily - monitor blood sugar and blood pressure at home  3.  Right leg shows improved strength with PT.  Continue home exercises.    She will be transitioning care to providers in [place], [place].    The duration of this appointment visit was 30 minutes of face-to-face time with the patient.  Greater than 50% of this time was spent in counseling, explanation of diagnosis, planning of further management, and coordination of care.   Thank you for allowing me to participate in patient's care.  If I can answer any additional questions, I would be pleased to do so.    Sincerely,    Brinda Focht K. Posey Pronto, DO

## 2015-09-17 ENCOUNTER — Encounter: Payer: Medicare Other | Admitting: Family Medicine

## 2016-01-14 ENCOUNTER — Telehealth: Payer: Self-pay | Admitting: Family Medicine

## 2016-01-14 NOTE — Telephone Encounter (Signed)
Called patient to schedule annual wellness appt. Pt did not answer and vm is not set up. Will attempt to call pt back.

## 2016-01-20 NOTE — Telephone Encounter (Signed)
Second attempt to call patient to schedule annual wellness appt. Pt's vm has not been set up. Could not leave message.

## 2016-03-25 NOTE — Telephone Encounter (Signed)
Reached out to patient to schedule AWV unable to leave message

## 2016-06-24 NOTE — Telephone Encounter (Signed)
Called patient to schedule AWV w/ [facility] and follow-up office visit w/ PCP. No answer, no voicemail.

## 2016-06-25 ENCOUNTER — Encounter: Payer: Self-pay | Admitting: Gastroenterology

## 2016-07-03 ENCOUNTER — Encounter: Payer: Self-pay | Admitting: Gastroenterology

## 2016-10-11 IMAGING — MR MR HEAD W/O CM
10 of 11 series · 35 of 48 positions shown · non-contrast
Comparison: CTA head and neck 03/15/2015.

CLINICAL DATA: Acute onset of left hemi able isthmus and degree of
form of movements. Question stroke. Hypertension. Cerebral vascular
disease.

EXAM:
MRI HEAD WITHOUT CONTRAST
MRA HEAD WITHOUT CONTRAST
TECHNIQUE: Multiplanar, multiecho pulse sequences of the brain and surrounding
structures were obtained without intravenous contrast. Angiographic
images of the head were obtained using MRA technique without
contrast.

[Series 6: DWI · axial · 3.0mm · 1.44mm/px · z∈[-109,+51]mm · 6 of 100 slices shown (1 of 4)]
[im 1/100]
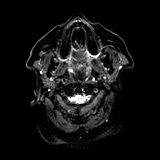
[im 20/100]
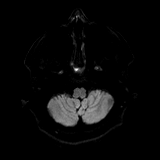
[im 40/100]
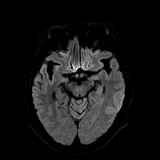
[im 60/100]
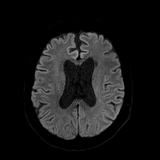
[im 80/100]
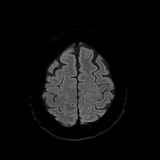
[im 100/100]
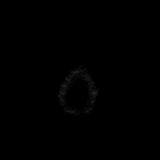

[Series 7: DWI · axial · 3.0mm · 1.44mm/px · z∈[-109,+51]mm · 3 of 50 slices shown (2 of 4)]
[im 1/50]
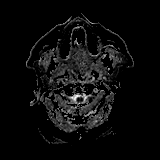
[im 25/50]
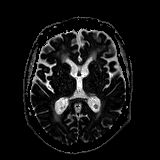
[im 50/50]
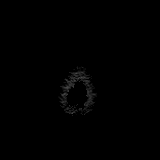

[Series 8: DWI · coronal · 5.0mm · 1.44mm/px · 4 of 68 slices shown (3 of 4)]
[im 1/68]
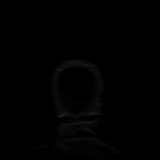
[im 23/68]
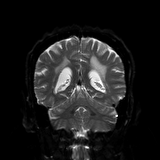
[im 45/68]
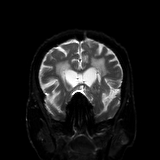
[im 68/68]
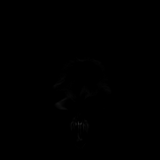

[Series 9: DWI · coronal · 5.0mm · 1.44mm/px · 2 of 34 slices shown (4 of 4)]
[im 1/34]
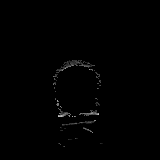
[im 34/34]
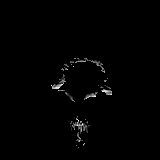

[Series 14: [id number]-[id number] · axial · 0.6mm · 0.26mm/px · z∈[-119,-77]mm · 4 of 162 slices shown]
[im 1/162]
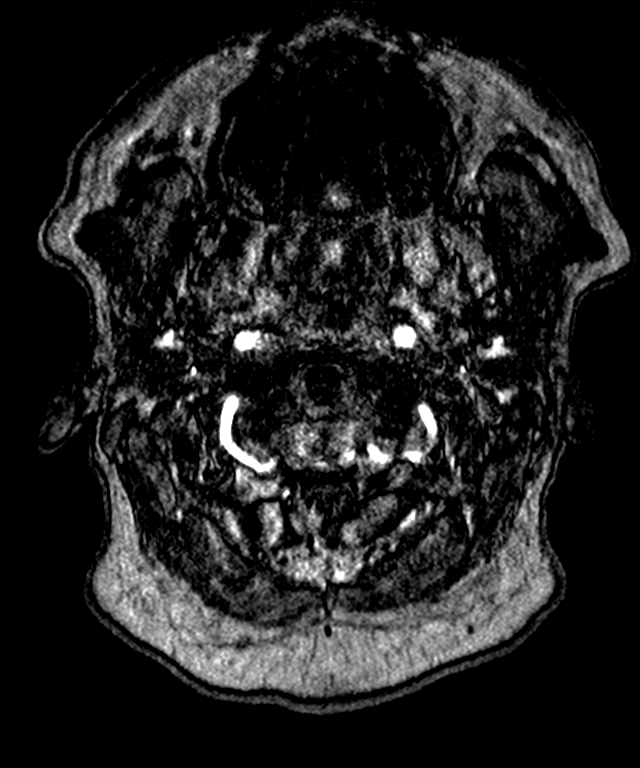
[im 18/162]
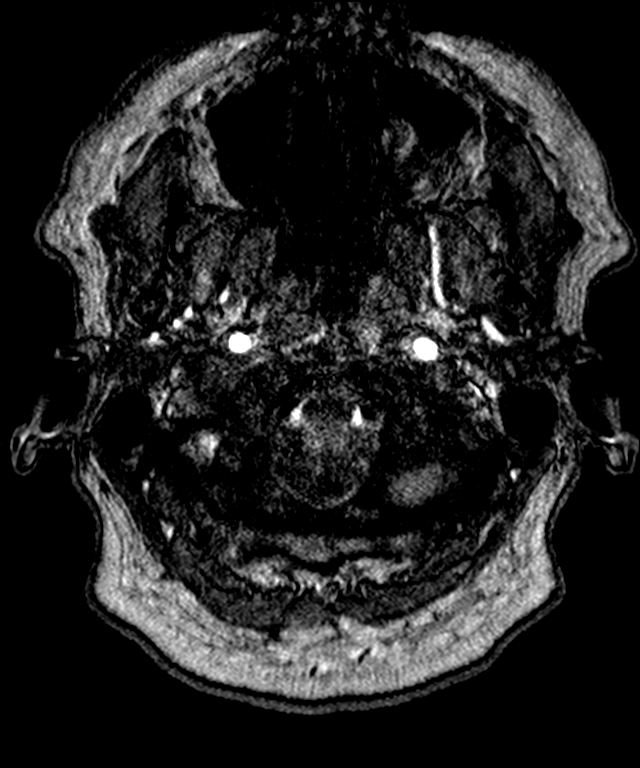
[im 54/162]
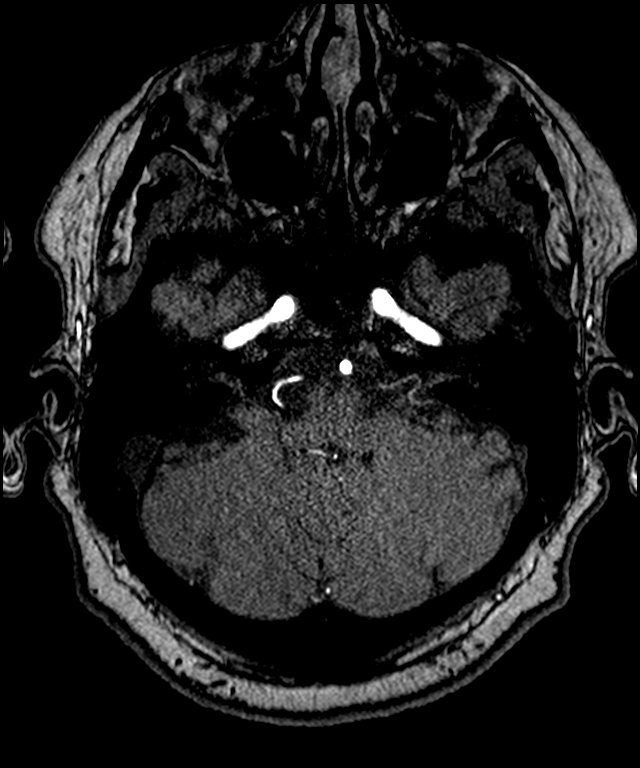
[im 72/162]
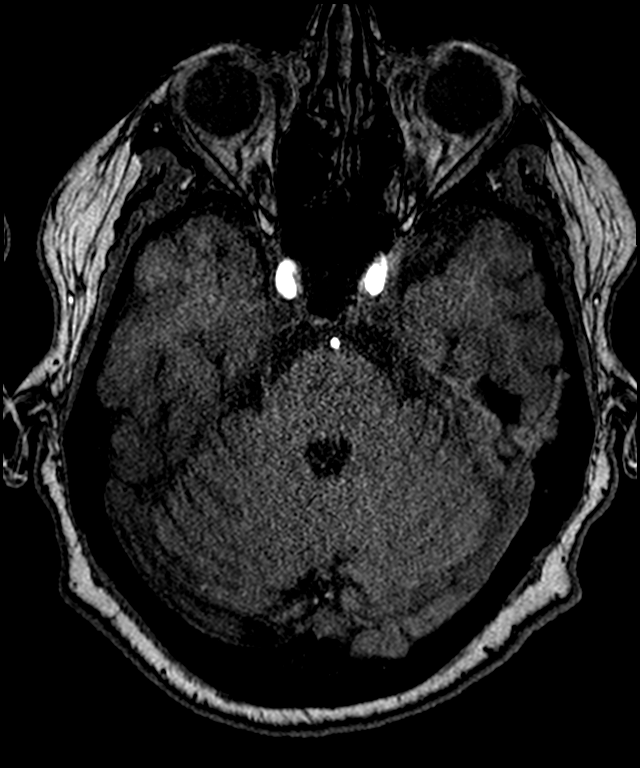

[Series 18: FLAIR · axial · 4.0mm · 0.72mm/px · z∈[-112,+48]mm · 2 of 32 slices shown]
[im 1/32]
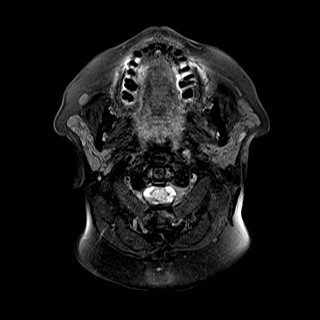
[im 32/32]
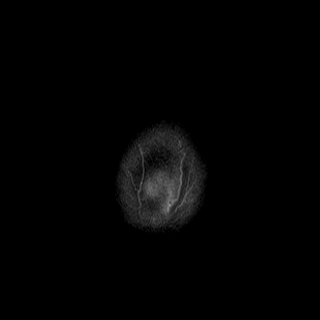

[Series 22: T1 · axial · 1.0mm · 0.90mm/px · z∈[-104,+38]mm · 8 of 144 slices shown (1 of 2)]
[im 1/144]
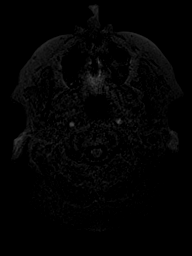
[im 18/144]
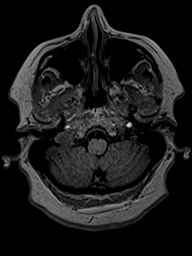
[im 36/144]
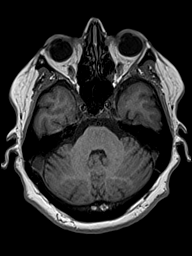
[im 54/144]
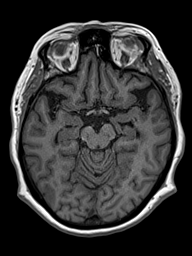
[im 90/144]
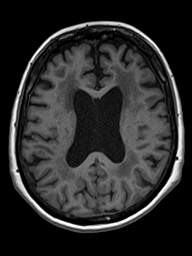
[im 108/144]
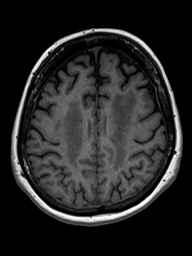
[im 126/144]
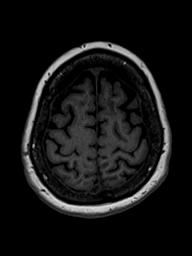
[im 144/144]
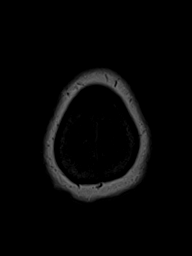

[Series 23: T2 · coronal · 4.5mm · 0.36mm/px · 2 of 30 slices shown (1 of 2)]
[im 1/30]
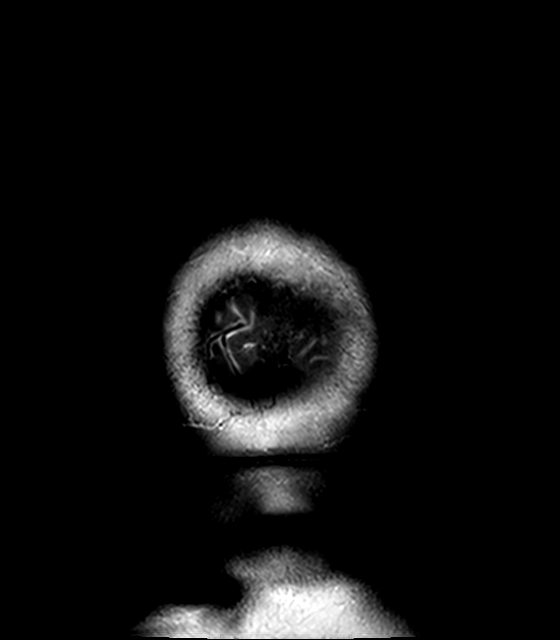
[im 30/30]
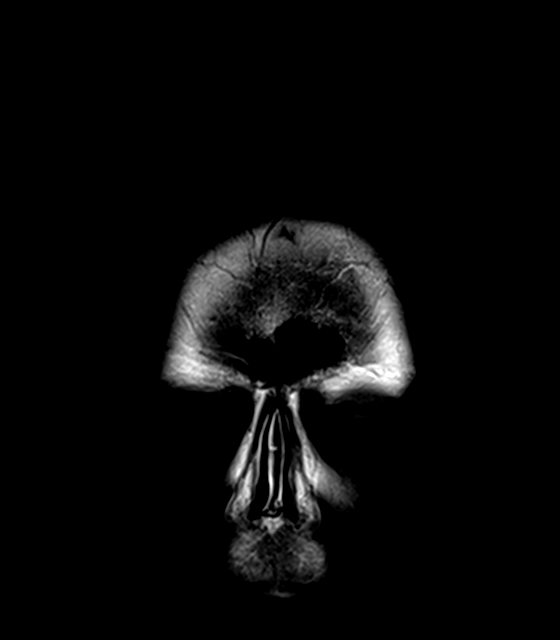

[Series 24: T1 · sagittal · 4.0mm · 0.75mm/px · 2 of 31 slices shown (2 of 2)]
[im 1/31]
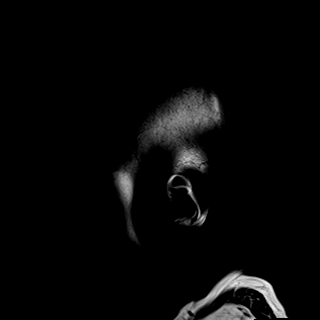
[im 31/31]
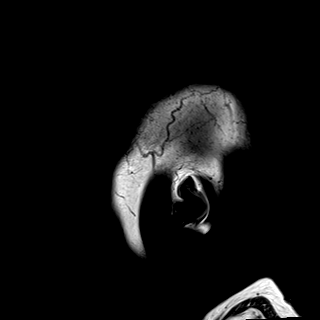

[Series 25: T2 · axial · 4.0mm · 0.36mm/px · z∈[-113,+47]mm · 2 of 32 slices shown (2 of 2)]
[im 1/32]
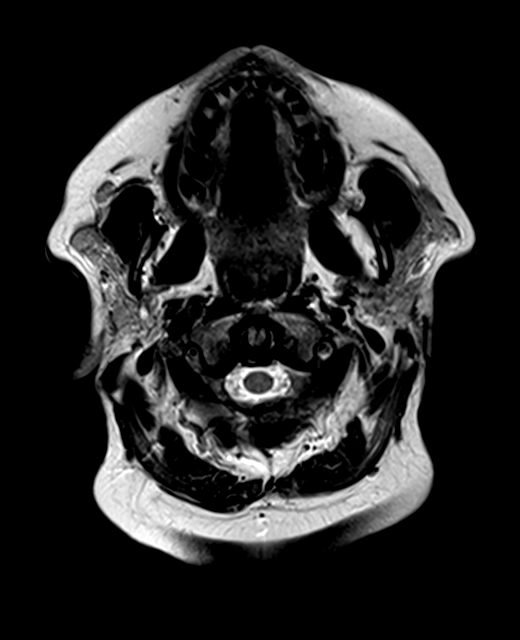
[im 32/32]
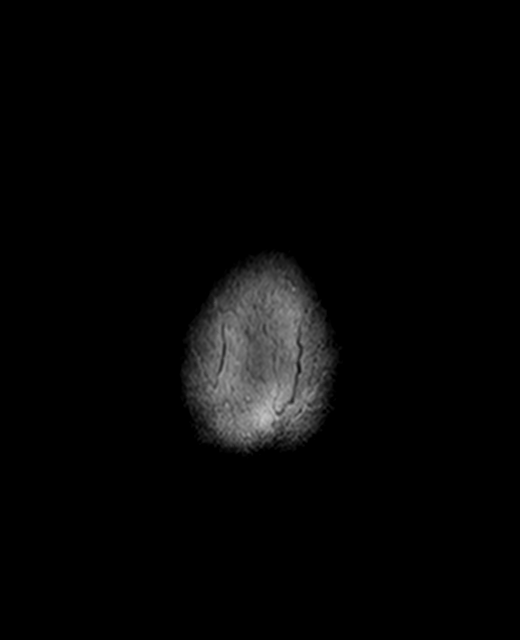

[35 of 48 positions shown; findings below may reference images not displayed]

FINDINGS: MRI HEAD FINDINGS

The diffusion-weighted images demonstrate no evidence for acute or
subacute infarction. A remote lacunar infarct is again noted in the
left external capsule anteriorly. Dilated perivascular spaces are
noted throughout the basal ganglia. There is a remote lacunar
infarct in the lateral right thalamus and posterior limb internal
capsule. Moderate atrophy and advanced diffuse white matter disease
is present bilaterally. White matter changes extend into the
brainstem.

The internal auditory canals are within normal limits bilaterally.
Flow is present in the major intracranial arteries.

Bilateral lens replacements are present the the globes and orbits
are otherwise intact. Mucosal thickening is noted along the floor of
the left maxillary sinus. There is opacification of a single
posterior left ethmoid air cell. The sphenoid sinuses and frontal
sinuses are clear. The mastoid air cells are clear.

Skullbase is within normal limits. Midline sagittal images are
unremarkable.

MRA HEAD FINDINGS

The internal carotid arteries are within normal limits from the high
cervical segments to the skullbase. There is mild atherosclerotic
narrowing of the supraclinoid internal carotid arteries, right
greater than left. There is no significant stenosis of greater than
50%.

Mild narrowing of the mid right M1 segment is again noted. There is
a high-grade stenosis of the proximal right A1 segment. The left A1
and M1 segments are within normal limits. The anterior communicating
artery is patent. Both anterior cerebral arteries fill. The MCA
bifurcations are intact. There is mild attenuation of distal MCA
branch vessels bilaterally.

Moderate signal loss is present in the right vertebral artery and
proximal to the right PICA. The right PICA origin is visualized and
within normal limits. There is a high-grade stenosis of the left
vertebral artery just beyond the left PICA origin. Mild narrowing is
present proximal to this area. The vertebrobasilar junction is
intact. The basilar artery is within normal limits. Both posterior
cerebral arteries predominantly fill from posterior communicating
arteries. The basilar artery sends a small left P1 can't
contribution. Segmental narrowing is present in the distal PCA
branch vessels bilaterally.
IMPRESSION: 1. No acute infarct.
2. Moderate generalized atrophy and advanced diffuse white matter
disease reflecting the sequela of chronic microvascular ischemia.
3. Remote white matter infarct in the anterior aspect of the left
external capsule.
4. Remote lacunar infarct in the lateral right thalamus.
5. Mild to moderate narrowing in the mid right M1 segment.
6. High-grade stenosis of the proximal right A1 segment.
7. High-grade stenosis of the left vertebral artery just beyond the
PICA.
8. Moderate narrowing of the right vertebral artery.
9. Fetal type posterior cerebral arteries bilaterally with a small
P1 segment on the left.
10. The MRA demonstrates diffuse distal small vessel disease as on
the previous study.

## 2021-12-06 DIAGNOSIS — E119 Type 2 diabetes mellitus without complications: Secondary | ICD-10-CM

## 2022-05-13 NOTE — Telephone Encounter (Signed)
Done
